# Patient Record
Sex: Female | Born: 1950 | Race: White | Hispanic: No | Marital: Married | Smoking: Current every day smoker
Health system: Southern US, Community
[De-identification: ages and names within clinical notes are randomized; demographics above are authoritative.]

## PROBLEM LIST (undated history)

## (undated) DIAGNOSIS — E039 Hypothyroidism, unspecified: Secondary | ICD-10-CM

## (undated) DIAGNOSIS — M797 Fibromyalgia: Secondary | ICD-10-CM

## (undated) DIAGNOSIS — I499 Cardiac arrhythmia, unspecified: Secondary | ICD-10-CM

## (undated) DIAGNOSIS — F331 Major depressive disorder, recurrent, moderate: Secondary | ICD-10-CM

## (undated) DIAGNOSIS — M810 Age-related osteoporosis without current pathological fracture: Secondary | ICD-10-CM

## (undated) DIAGNOSIS — I10 Essential (primary) hypertension: Secondary | ICD-10-CM

## (undated) DIAGNOSIS — F17200 Nicotine dependence, unspecified, uncomplicated: Secondary | ICD-10-CM

## (undated) DIAGNOSIS — J189 Pneumonia, unspecified organism: Secondary | ICD-10-CM

## (undated) DIAGNOSIS — N1831 Chronic kidney disease, stage 3a: Secondary | ICD-10-CM

## (undated) DIAGNOSIS — S72009A Fracture of unspecified part of neck of unspecified femur, initial encounter for closed fracture: Secondary | ICD-10-CM

## (undated) DIAGNOSIS — R0600 Dyspnea, unspecified: Secondary | ICD-10-CM

## (undated) DIAGNOSIS — M1990 Unspecified osteoarthritis, unspecified site: Secondary | ICD-10-CM

## (undated) DIAGNOSIS — D071 Carcinoma in situ of vulva: Secondary | ICD-10-CM

## (undated) DIAGNOSIS — T8859XA Other complications of anesthesia, initial encounter: Secondary | ICD-10-CM

## (undated) DIAGNOSIS — I70299 Other atherosclerosis of native arteries of extremities, unspecified extremity: Secondary | ICD-10-CM

## (undated) DIAGNOSIS — Z95820 Peripheral vascular angioplasty status with implants and grafts: Secondary | ICD-10-CM

## (undated) DIAGNOSIS — I739 Peripheral vascular disease, unspecified: Secondary | ICD-10-CM

## (undated) DIAGNOSIS — E079 Disorder of thyroid, unspecified: Secondary | ICD-10-CM

## (undated) DIAGNOSIS — K219 Gastro-esophageal reflux disease without esophagitis: Secondary | ICD-10-CM

## (undated) HISTORY — DX: Cardiac arrhythmia, unspecified: I49.9

## (undated) HISTORY — DX: Unspecified osteoarthritis, unspecified site: M19.90

## (undated) HISTORY — DX: Disorder of thyroid, unspecified: E07.9

## (undated) HISTORY — DX: Gastro-esophageal reflux disease without esophagitis: K21.9

## (undated) HISTORY — DX: Essential (primary) hypertension: I10

## (undated) HISTORY — PX: CHOLECYSTECTOMY: SHX55

## (undated) HISTORY — DX: Fracture of unspecified part of neck of unspecified femur, initial encounter for closed fracture: S72.009A

## (undated) HISTORY — DX: Carcinoma in situ of vulva: D07.1

## (undated) HISTORY — DX: Fibromyalgia: M79.7

---

## 1978-09-29 HISTORY — PX: OTHER SURGICAL HISTORY: SHX169

## 1993-01-28 HISTORY — PX: ABDOMINAL HYSTERECTOMY: SHX81

## 2007-12-01 ENCOUNTER — Inpatient Hospital Stay: Payer: Self-pay | Admitting: Internal Medicine

## 2008-01-14 ENCOUNTER — Ambulatory Visit: Payer: Self-pay | Admitting: Gastroenterology

## 2008-06-28 ENCOUNTER — Ambulatory Visit: Payer: Self-pay | Admitting: Internal Medicine

## 2008-06-29 ENCOUNTER — Ambulatory Visit: Payer: Self-pay | Admitting: Internal Medicine

## 2008-07-28 ENCOUNTER — Ambulatory Visit: Payer: Self-pay | Admitting: Internal Medicine

## 2008-08-23 ENCOUNTER — Ambulatory Visit: Payer: Self-pay | Admitting: Internal Medicine

## 2008-08-28 ENCOUNTER — Ambulatory Visit: Payer: Self-pay | Admitting: Internal Medicine

## 2008-11-28 ENCOUNTER — Ambulatory Visit: Payer: Self-pay | Admitting: Internal Medicine

## 2008-12-20 ENCOUNTER — Ambulatory Visit: Payer: Self-pay | Admitting: Internal Medicine

## 2009-05-31 ENCOUNTER — Ambulatory Visit: Payer: Self-pay | Admitting: Rheumatology

## 2009-06-28 ENCOUNTER — Ambulatory Visit: Payer: Self-pay | Admitting: Internal Medicine

## 2009-07-10 ENCOUNTER — Ambulatory Visit: Payer: Self-pay | Admitting: Internal Medicine

## 2009-07-28 ENCOUNTER — Ambulatory Visit: Payer: Self-pay | Admitting: Internal Medicine

## 2010-02-20 ENCOUNTER — Ambulatory Visit: Payer: Self-pay | Admitting: Internal Medicine

## 2010-02-28 ENCOUNTER — Ambulatory Visit: Payer: Self-pay | Admitting: Internal Medicine

## 2010-06-20 ENCOUNTER — Inpatient Hospital Stay: Payer: Self-pay | Admitting: Emergency Medicine

## 2010-06-21 HISTORY — PX: APPENDECTOMY: SHX54

## 2010-06-26 LAB — PATHOLOGY REPORT

## 2010-07-31 ENCOUNTER — Ambulatory Visit: Payer: Self-pay | Admitting: Internal Medicine

## 2010-08-09 ENCOUNTER — Emergency Department: Payer: Self-pay | Admitting: Unknown Physician Specialty

## 2010-08-29 ENCOUNTER — Ambulatory Visit: Payer: Self-pay | Admitting: Internal Medicine

## 2011-03-13 ENCOUNTER — Ambulatory Visit: Payer: Self-pay | Admitting: Internal Medicine

## 2012-05-26 ENCOUNTER — Ambulatory Visit: Payer: Self-pay | Admitting: Gynecologic Oncology

## 2012-05-28 ENCOUNTER — Ambulatory Visit: Payer: Self-pay | Admitting: Gynecologic Oncology

## 2012-07-14 ENCOUNTER — Encounter: Payer: Self-pay | Admitting: Nurse Practitioner

## 2012-07-14 ENCOUNTER — Encounter: Payer: Self-pay | Admitting: Cardiothoracic Surgery

## 2012-07-28 ENCOUNTER — Encounter: Payer: Self-pay | Admitting: Nurse Practitioner

## 2012-07-28 ENCOUNTER — Encounter: Payer: Self-pay | Admitting: Cardiothoracic Surgery

## 2013-02-25 ENCOUNTER — Ambulatory Visit: Payer: Self-pay | Admitting: Neurosurgery

## 2013-04-26 ENCOUNTER — Ambulatory Visit: Payer: Self-pay | Admitting: Gynecologic Oncology

## 2013-05-03 ENCOUNTER — Ambulatory Visit: Payer: Self-pay | Admitting: Gynecologic Oncology

## 2013-05-17 ENCOUNTER — Other Ambulatory Visit: Payer: Self-pay | Admitting: General Surgery

## 2013-05-17 ENCOUNTER — Encounter: Payer: Self-pay | Admitting: General Surgery

## 2013-05-17 ENCOUNTER — Ambulatory Visit (INDEPENDENT_AMBULATORY_CARE_PROVIDER_SITE_OTHER): Payer: BC Managed Care – PPO | Admitting: General Surgery

## 2013-05-17 VITALS — BP 132/74 | HR 74 | Resp 16 | Ht 60.0 in | Wt 223.0 lb

## 2013-05-17 DIAGNOSIS — K469 Unspecified abdominal hernia without obstruction or gangrene: Secondary | ICD-10-CM | POA: Insufficient documentation

## 2013-05-17 DIAGNOSIS — F17200 Nicotine dependence, unspecified, uncomplicated: Secondary | ICD-10-CM

## 2013-05-17 DIAGNOSIS — F17210 Nicotine dependence, cigarettes, uncomplicated: Secondary | ICD-10-CM

## 2013-05-17 NOTE — Progress Notes (Addendum)
Patient ID: Tracy FillerDebra S Penninger, female   DOB: 05/01/1950, 63 y.o.   MRN: [id number]  Chief Complaint  Patient presents with  . Other    abdominal hernia    HPI Tracy Durham is a 63 y.o. female who presents for an evaluation of an abdominal hernia. Patient states she noticed it about one year ago and its getting bigger. She states she is having occasional burning pains in her right lower quadrant.  The patient reports no difficulty with bowel or bladder function. 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  The patient is primary caregiver for her 11080 year old mother who now lives with the patient.  HPI  Past Medical History  Diagnosis Date  . Arthritis   . Fibromyalgia   . Thyroid disease   . Hypertension   . GERD (gastroesophageal reflux disease)   . Irregular heart beat   . Fractured hip     Past Surgical History  Procedure Laterality Date  . Appendectomy  2013  . Cesarean section    . Cholecystectomy    . Gastric stapeling  1980's  . Abdominal hysterectomy  1995    [facility]    Family History  Problem Relation Age of Onset  . Alcohol abuse Mother   . Dementia Mother   . Cancer Mother     ovarian  . Diabetes Father   . Hypertension Father   . Stroke Father   . Alcohol abuse Brother   . Alcohol abuse Sister   . Cancer Sister     cervical    Social History History  Substance Use Topics  . Smoking status: Current Every Day Smoker -- 0.50 packs/day for 30 years  . Smokeless tobacco: Not on file  . Alcohol Use: No    Allergies  Allergen Reactions  . Bactrim [Sulfamethoxazole-Tmp Ds] Hives  . Biaxin [Clarithromycin] Hives  . Ciprofloxacin Hives  . Keflex [Cephalexin] Hives  . Morphine And Related Hives  . Sulfa Antibiotics Hives  . Tramadol Hives    Current Outpatient Prescriptions  Medication Sig Dispense Refill  . amLODipine (NORVASC) 5 MG tablet Take 1 tablet by mouth daily.      . chlorzoxazone (PARAFON) 500 MG tablet Take 1  tablet by mouth daily.      . citalopram (CELEXA) 40 MG tablet Take 40 mg by mouth daily.      . hydrochlorothiazide (MICROZIDE) 12.5 MG capsule Take 1 capsule by mouth daily.      Marland Kitchen. HYDROcodone-acetaminophen (NORCO/VICODIN) 5-325 MG per tablet Take 1 tablet by mouth every 6 (six) hours as needed for moderate pain.      Marland Kitchen. levothyroxine (SYNTHROID, LEVOTHROID) 50 MCG tablet Take 1 tablet by mouth daily.      . metoprolol (LOPRESSOR) 50 MG tablet Take 1 tablet by mouth daily.      . naproxen (NAPROSYN) 500 MG tablet Take 1 tablet by mouth daily.      Marland Kitchen. omeprazole (PRILOSEC) 40 MG capsule Take 1 capsule by mouth daily.      . potassium chloride SA (K-DUR,KLOR-CON) 20 MEQ tablet 1/2 tab by mouth on Monday, Wednesday, and Friday.       No current facility-administered medications for this visit.    Review of Systems Review of Systems  Constitutional: Negative.   Respiratory: Negative.   Cardiovascular: Negative.   Gastrointestinal: Negative.     Blood pressure 132/74, pulse 74, resp. rate 16, height 5' (1.524 m), weight 223 lb (101.152 kg).  Physical  Exam Physical Exam  Constitutional: She is oriented to person, place, and time. She appears well-developed and well-nourished.  Eyes: Conjunctivae are normal.  Neck: Neck supple.  Cardiovascular: Normal rate, regular rhythm, normal heart sounds, intact distal pulses and normal pulses.   Pulmonary/Chest: Effort normal and breath sounds normal.  Abdominal: Soft. Normal appearance and bowel sounds are normal. There is tenderness in the right lower quadrant.    Neurological: She is alert and oriented to person, place, and time.  Skin: Skin is warm and dry.    Data Reviewed Operative report from a Jun 01, 2010 was reviewed. Extensive adhesions identified. Acute appendicitis with perforation. Pathology showed acute appendicitis and periappendicitis. Preprocedure CT scan showed evidence of acute appendicitis with no definite abscess. A drain  was placed postoperatively. Cultures were not obtained.  GYN oncology notes of May 04, 2013 were reviewed.  Assessment    Incisional hernia.     Plan    CT scan to evaluate for the extent of the hernia and other possible fascial defects not clinically appreciable is indicated. The patient is at high risk for recurrence based on her age, obesity and ongoing smoking. Only one of these is readily malleable, and smoking cessation has been encouraged. Considering the stresses of caring for her mother and her job in customer service, this may be difficult. We'll plan to reassess her after CT is available.     Patient is scheduled for a CT of the abdomen and pelvis with contrast [facility] on 05[facility]/13, she will arrive at 8:45 am. Patient will have Creat[facility]. Patient is aware of date, time, and instructions.  PCP: Carmine SavoyKhan, Neelam S    Trenton Passow W Ekam Besson 05/17/2013, 9:19 PM

## 2013-05-17 NOTE — Patient Instructions (Addendum)
Patient to have cat scan .  Patient is scheduled for a CT of the abdomen and pelvis with [facility] on 05/[facility]/13, she will arrive at 8:45 am. Patient will have Creatinine and Bun [facility]. Patient is aware of date, time, and instructions.

## 2013-05-18 LAB — BUN+CREAT
BUN / CREAT RATIO: 18 (ref 11–26)
BUN: 14 mg/dL (ref 8–27)
CREATININE: 0.8 mg/dL (ref 0.57–1.00)
GFR calc Af Amer: 92 mL/min/{1.73_m2} (ref >59)
GFR calc non Af Amer: 80 mL/min/{1.73_m2} (ref >59)

## 2013-05-24 ENCOUNTER — Encounter: Payer: Self-pay | Admitting: General Surgery

## 2013-05-24 ENCOUNTER — Telehealth: Payer: Self-pay

## 2013-05-24 NOTE — Telephone Encounter (Signed)
Patient called asking about her CT results.

## 2013-05-25 ENCOUNTER — Telehealth: Payer: Self-pay | Admitting: *Deleted

## 2013-05-25 NOTE — Telephone Encounter (Signed)
Notified patient as instructed.  She does want to schedule hernia surgery, she will try to stop smoking before her appt with Dr. Lemar LivingsByrnett on May 19th 2015 at 2:45.

## 2013-05-25 NOTE — Telephone Encounter (Signed)
Message copied by Currie ParisHATCH, Marita Burnsed M on Tue May 25, 2013  8:27 AM ------      Message from: GarrisonBYRNETT, UtahJEFFREY W      Created: Mon May 24, 2013  9:53 PM       Please notify the patient the CT shows only the one hernia at the site of her appendectomy.        Ideally, surgery would be postponed until after she has discontinued smoking for one month.       I recognize this may not be possible.      Arrange f/u to review surgical options at a time convenient for the patient. Thanks. ------

## 2013-05-28 ENCOUNTER — Ambulatory Visit: Payer: Self-pay | Admitting: Gynecologic Oncology

## 2013-06-15 ENCOUNTER — Encounter: Payer: Self-pay | Admitting: General Surgery

## 2013-06-15 ENCOUNTER — Ambulatory Visit (INDEPENDENT_AMBULATORY_CARE_PROVIDER_SITE_OTHER): Payer: BC Managed Care – PPO | Admitting: General Surgery

## 2013-06-15 VITALS — BP 126/72 | HR 56 | Resp 12 | Ht 60.0 in | Wt 224.0 lb

## 2013-06-15 DIAGNOSIS — K432 Incisional hernia without obstruction or gangrene: Secondary | ICD-10-CM | POA: Insufficient documentation

## 2013-06-15 DIAGNOSIS — K469 Unspecified abdominal hernia without obstruction or gangrene: Secondary | ICD-10-CM

## 2013-06-15 NOTE — Patient Instructions (Addendum)
Open Hernia Repair Open hernia repair is surgery to fix a hernia. A hernia occurs when an internal organ or tissue pushes out through a weak spot in the abdominal wall muscles. Hernias commonly occur in the groin and around the navel. Most hernias tend to get worse over time. Surgery is often done to prevent the hernia from getting bigger, becoming uncomfortable, or becoming an emergency. Emergency surgery may be needed if abdominal contents get stuck in the opening (incarcerated hernia) or the blood supply gets cut off (strangulated hernia). In an open repair, a large cut (incision) is made in the abdomen to perform the surgery. LET [facility] CARE PROVIDER KNOW ABOUT:  Any allergies you have.  All medicines you are taking, including vitamins, herbs, eye drops, creams, and over-the-counter medicines.  Previous problems you or members of your family have had with the use of anesthetics.  Any blood disorders you have.  Previous surgeries you have had.  Medical conditions you have. RISKS AND COMPLICATIONS Generally, this is a safe procedure. However, as with any procedure, complications can occur. Possible complications include:  Infection.  Bleeding.  Nerve injury.  Chronic pain.  The hernia can come back.  Injury to the intestines. BEFORE THE PROCEDURE  Ask your health care provider about changing or stopping any regular medicines. Avoid taking aspirin or blood thinners as directed by your health care provider.  Do noteat or drink anything after midnight the night before surgery.  If you smoke, do not smoke for at least 2 weeks before your surgery.  Do not drink alcohol the day before your surgery.  Let your health care provider know if you develop a cold or any infection before your surgery.  Arrange for someone to drive you home after the procedure or after your hospital stay. Also arrange for someone to help you with activities during recovery. PROCEDURE   Small  monitors will be put on your body. They are used to check your heart, blood pressure, and oxygen level.   An IV access tube will be put into one of your veins. Medicine will be able to flow directly into your body through this IV tube.   You might be given a medicine to help you relax (sedative).   You will be given a medicine to make you sleep (general anesthetic). A breathing tube may be placed into your lungs during the procedure.  A cut (incision) is made over the hernia defect, and the contents are pushed back into the abdomen.  If the hernia is small, stitches may be used to bring the muscle edges back together.  Typically, a surgeon will place a mesh patch made of man-made material (synthetic) to cover the defect. The mesh is sewn to healthy muscle. This reduces the risk of the hernia coming back.  The tissue and skin over the hernia are then closed with stitches or staples.  If the hernia was large, a drain may be left in place to collect excess fluid where the hernia used to be.  Bandages (dressings) are used to cover the incision. AFTER THE PROCEDURE  You will be taken to a recovery area where your progress will be monitored.  If the hernia was small or in the groin (inguinal) region, you will likely be allowed to go home once you are awake, stable, and taking fluids well.  If the hernia was large, you may have to wait for your bowel function to return. You may need to stay in the hospital  for 2 3 days until you can eat and your pain is controlled. A drain may be left in place for 5 7 days. You will be taught how to care for the drain. Document Released: 07/10/2000 Document Revised: 11/04/2012 Document Reviewed: 08/26/2012 [facility] Patient Information 2014 [employer] [place].  Patient [facility] been [facility] on [facility]. She will pre admit by phone on 06/17/13. Patient is aware of date and instructions.

## 2013-06-15 NOTE — Progress Notes (Signed)
Patient ID: Tracy Durham, female   DOB: 05/09/1950, 63 y.o.   MRN: [id number]  Chief Complaint  Patient presents with  . Pre-op Exam    Incisional hernia.     HPI Tracy FillerDebra S Loyola is a 63 y.o. female here today for her pre op Incisional hernia surgery. The patient has discontinued smoking as discussed at her initial visit. This was the only short-term option available to decrease her morbidity and recurrence risk after hernia repair.  HPI  Past Medical History  Diagnosis Date  . Arthritis   . Fibromyalgia   . Thyroid disease   . Hypertension   . GERD (gastroesophageal reflux disease)   . Irregular heart beat   . Fractured hip     Past Surgical History  Procedure Laterality Date  . Cesarean section    . Cholecystectomy    . Gastric stapeling  1980's  . Abdominal hysterectomy  1995    [facility]  . Appendectomy  Jun 21, 2010    acute appendicitis and periappendicitis    Family History  Problem Relation Age of Onset  . Alcohol abuse Mother   . Dementia Mother   . Cancer Mother     ovarian  . Diabetes Father   . Hypertension Father   . Stroke Father   . Alcohol abuse Brother   . Alcohol abuse Sister   . Cancer Sister     cervical    Social History History  Substance Use Topics  . Smoking status: Former Smoker -- 0.50 packs/day for 30 years    Quit date: 05/25/2013  . Smokeless tobacco: Never Used  . Alcohol Use: No    Allergies  Allergen Reactions  . Bactrim [Sulfamethoxazole-Tmp Ds] Hives  . Biaxin [Clarithromycin] Hives  . Ciprofloxacin Hives  . Keflex [Cephalexin] Hives  . Morphine And Related Hives  . Sulfa Antibiotics Hives  . Tramadol Hives    Current Outpatient Prescriptions  Medication Sig Dispense Refill  . amLODipine (NORVASC) 5 MG tablet Take 1 tablet by mouth daily.      . chlorzoxazone (PARAFON) 500 MG tablet Take 1 tablet by mouth daily.      . citalopram (CELEXA) 40 MG tablet Take 40 mg by mouth daily.      . hydrochlorothiazide  (MICROZIDE) 12.5 MG capsule Take 1 capsule by mouth daily.      Marland Kitchen. HYDROcodone-acetaminophen (NORCO/VICODIN) 5-325 MG per tablet Take 1 tablet by mouth every 6 (six) hours as needed for moderate pain.      Marland Kitchen. levothyroxine (SYNTHROID, LEVOTHROID) 50 MCG tablet Take 1 tablet by mouth daily.      . metoprolol (LOPRESSOR) 50 MG tablet Take 1 tablet by mouth daily.      . naproxen (NAPROSYN) 500 MG tablet Take 1 tablet by mouth daily.      Marland Kitchen. omeprazole (PRILOSEC) 40 MG capsule Take 1 capsule by mouth daily.      . potassium chloride SA (K-DUR,KLOR-CON) 20 MEQ tablet 1/2 tab by mouth on Monday, Wednesday, and Friday.       No current facility-administered medications for this visit.    Review of Systems Review of Systems  Constitutional: Negative.   Respiratory: Negative.   Cardiovascular: Negative.     Blood pressure 126/72, pulse 56, resp. rate 12, height 5' (1.524 m), weight 224 lb (101.606 kg).  Physical Exam Physical Exam  Constitutional: She is oriented to person, place, and time. She appears well-developed and well-nourished.  Eyes: Conjunctivae are normal. No scleral  icterus.  Neck: Neck supple.  Cardiovascular: Normal rate, regular rhythm and normal heart sounds.   Pulmonary/Chest: Effort normal and breath sounds normal.  Abdominal: Soft. Normal appearance and bowel sounds are normal. There is no tenderness. A hernia is present.  Lymphadenopathy:    She has no cervical adenopathy.  Neurological: She is alert and oriented to person, place, and time.  Skin: Skin is warm and dry.      Data Reviewed CT of the abdomen and pelvis completed May 20, 2013 at [facility] had previously been reviewed. This identifies a single 4 x 7 cm defect at site of paramedian incision containing non-obstructed ascending colon and small bowel. No clearly identifiable secondary defects.    Assessment    Incisional hernia.     Plan    The patient has done very well with smoking cessation  as as evidence by today's pulmonary exam and her reported improvement in exercise tolerance. Considering the extensive adhesions described at the time of her appendectomy, I think she will be best served by an open procedure. The roll of prosthetic mesh to minimize recurrence was reviewed. The importance of ongoing smoking cessation was emphasized. I anticipate she may require 24-48 hours postoperative observation for comfort.     Patient has been [facility] on [facility]. She will pre admit by phone on 06/17/13. Patient is aware of date and instructions.   PCP: Carmine SavoyKhan, Neelam S   Marylee Belzer W Huston Stonehocker 06/15/2013, 6:55 PM

## 2013-06-16 ENCOUNTER — Telehealth: Payer: Self-pay | Admitting: *Deleted

## 2013-06-16 ENCOUNTER — Other Ambulatory Visit: Payer: Self-pay | Admitting: General Surgery

## 2013-06-16 DIAGNOSIS — K432 Incisional hernia without obstruction or gangrene: Secondary | ICD-10-CM

## 2013-06-16 NOTE — Telephone Encounter (Signed)
Called pt to get info on what wound center she went to and what doctor she saw there

## 2013-06-22 ENCOUNTER — Ambulatory Visit: Payer: Self-pay | Admitting: General Surgery

## 2013-06-22 DIAGNOSIS — Z01810 Encounter for preprocedural cardiovascular examination: Secondary | ICD-10-CM

## 2013-06-22 DIAGNOSIS — I10 Essential (primary) hypertension: Secondary | ICD-10-CM

## 2013-06-22 DIAGNOSIS — R011 Cardiac murmur, unspecified: Secondary | ICD-10-CM

## 2013-06-22 LAB — CBC WITH DIFFERENTIAL/PLATELET
Basophil #: 0 10*3/uL (ref 0.0–0.1)
Basophil %: 0.5 %
EOS ABS: 0.4 10*3/uL (ref 0.0–0.7)
Eosinophil %: 6.8 %
HCT: 41.5 % (ref 35.0–47.0)
HGB: 13.9 g/dL (ref 12.0–16.0)
LYMPHS PCT: 18.8 %
Lymphocyte #: 1.1 10*3/uL (ref 1.0–3.6)
MCH: 32.3 pg (ref 26.0–34.0)
MCHC: 33.5 g/dL (ref 32.0–36.0)
MCV: 97 fL (ref 80–100)
Monocyte #: 0.7 x10 3/mm (ref 0.2–0.9)
Monocyte %: 11.9 %
NEUTROS PCT: 62 %
Neutrophil #: 3.5 10*3/uL (ref 1.4–6.5)
Platelet: 195 10*3/uL (ref 150–440)
RBC: 4.3 10*6/uL (ref 3.80–5.20)
RDW: 14.2 % (ref 11.5–14.5)
WBC: 5.7 10*3/uL (ref 3.6–11.0)

## 2013-06-22 LAB — BASIC METABOLIC PANEL
Anion Gap: 3 — ABNORMAL LOW (ref 7–16)
BUN: 20 mg/dL — ABNORMAL HIGH (ref 7–18)
CHLORIDE: 107 mmol/L (ref 98–107)
Calcium, Total: 8.9 mg/dL (ref 8.5–10.1)
Co2: 29 mmol/L (ref 21–32)
Creatinine: 0.87 mg/dL (ref 0.60–1.30)
EGFR (Non-African Amer.): >60
Glucose: 86 mg/dL (ref 65–99)
Osmolality: 279 (ref 275–301)
Potassium: 4.2 mmol/L (ref 3.5–5.1)
Sodium: 139 mmol/L (ref 136–145)

## 2013-06-23 ENCOUNTER — Encounter: Payer: Self-pay | Admitting: General Surgery

## 2013-06-28 ENCOUNTER — Ambulatory Visit: Payer: Self-pay | Admitting: General Surgery

## 2013-06-28 DIAGNOSIS — K432 Incisional hernia without obstruction or gangrene: Secondary | ICD-10-CM

## 2013-06-28 HISTORY — PX: HERNIA REPAIR: SHX51

## 2013-06-29 ENCOUNTER — Encounter: Payer: Self-pay | Admitting: General Surgery

## 2013-07-07 ENCOUNTER — Ambulatory Visit (INDEPENDENT_AMBULATORY_CARE_PROVIDER_SITE_OTHER): Payer: BC Managed Care – PPO | Admitting: General Surgery

## 2013-07-07 ENCOUNTER — Encounter: Payer: Self-pay | Admitting: General Surgery

## 2013-07-07 VITALS — BP 162/82 | HR 72 | Resp 16 | Ht 60.0 in | Wt 230.0 lb

## 2013-07-07 DIAGNOSIS — K432 Incisional hernia without obstruction or gangrene: Secondary | ICD-10-CM

## 2013-07-07 DIAGNOSIS — R600 Localized edema: Secondary | ICD-10-CM

## 2013-07-07 DIAGNOSIS — R609 Edema, unspecified: Secondary | ICD-10-CM

## 2013-07-07 NOTE — Progress Notes (Signed)
Patient ID: Tracy FillerDebra S Durham, female   DOB: 12/05/1950, 63 y.o.   MRN: [id number]  Chief Complaint  Patient presents with  . Follow-up    post op ventral hernia repair    HPI Tracy Durham is a 63 y.o. female who presents for a post op ventral hernia repair. The procedure was performed on 06/28/13. The patient states she has a lot of fluid that is noticed from her waist down. This was first reported by phone on Saturday July 03, 2013. The patient was instructed to make use of an additional dose of HCTZ and plans were for a brief visit at the hospital where she was attending her mother on the morning of June 7. That day she slept in and I did not get a chance to see her at that time. She reports that when she gets up in the morning the edema is markedly improved, but she's been sitting in the hospital with her mother and it worsens through the course of the day. The swelling is symmetrical and lower extremities into lesser extent and lower abdomen. Patient denies any calf tenderness shortness of breath or GI symptoms. No other complaints at this time.    HPI  Past Medical History  Diagnosis Date  . Arthritis   . Fibromyalgia   . Thyroid disease   . Hypertension   . GERD (gastroesophageal reflux disease)   . Irregular heart beat   . Fractured hip     Past Surgical History  Procedure Laterality Date  . Cesarean section    . Cholecystectomy    . Gastric stapeling  1980's  . Abdominal hysterectomy  1995    [facility]  . Appendectomy  Jun 21, 2010    acute appendicitis and periappendicitis  . Hernia repair  06/28/13    Family History  Problem Relation Age of Onset  . Alcohol abuse Mother   . Dementia Mother   . Cancer Mother     ovarian  . Diabetes Father   . Hypertension Father   . Stroke Father   . Alcohol abuse Brother   . Alcohol abuse Sister   . Cancer Sister     cervical    Social History History  Substance Use Topics  . Smoking status: Former Smoker -- 0.50  packs/day for 30 years    Quit date: 05/25/2013  . Smokeless tobacco: Never Used  . Alcohol Use: No    Allergies  Allergen Reactions  . Bactrim [Sulfamethoxazole-Tmp Ds] Hives  . Biaxin [Clarithromycin] Hives  . Ciprofloxacin Hives  . Keflex [Cephalexin] Hives  . Morphine And Related Hives  . Sulfa Antibiotics Hives  . Tramadol Hives    Current Outpatient Prescriptions  Medication Sig Dispense Refill  . amLODipine (NORVASC) 5 MG tablet Take 1 tablet by mouth daily.      . chlorzoxazone (PARAFON) 500 MG tablet Take 1 tablet by mouth daily as needed.       . citalopram (CELEXA) 40 MG tablet Take 40 mg by mouth daily.      . hydrochlorothiazide (MICROZIDE) 12.5 MG capsule Take 1 capsule by mouth daily.      Marland Kitchen. HYDROcodone-acetaminophen (NORCO/VICODIN) 5-325 MG per tablet Take 1 tablet by mouth every 6 (six) hours as needed for moderate pain.      Marland Kitchen. levothyroxine (SYNTHROID, LEVOTHROID) 50 MCG tablet Take 1 tablet by mouth daily.      . metoprolol (LOPRESSOR) 50 MG tablet Take 1 tablet by mouth daily.      .Marland Kitchen  naproxen (NAPROSYN) 500 MG tablet Take 1 tablet by mouth daily.      Marland Kitchen omeprazole (PRILOSEC) 40 MG capsule Take 1 capsule by mouth daily.      . potassium chloride SA (K-DUR,KLOR-CON) 20 MEQ tablet 1/2 tab by mouth on Monday, Wednesday, and Friday.       No current facility-administered medications for this visit.    Review of Systems Review of Systems  Constitutional: Negative.   Respiratory: Negative.   Cardiovascular: Negative.   Gastrointestinal: Negative.     Blood pressure 162/82, pulse 72, resp. rate 16, height 5' (1.524 m), weight 230 lb (104.327 kg).  Physical Exam Physical Exam  Constitutional: She is oriented to person, place, and time. She appears well-developed and well-nourished.  Cardiovascular: Normal rate, regular rhythm and normal heart sounds.   No murmur heard. 2 + edema   Pulmonary/Chest: Effort normal and breath sounds normal.  Abdominal: Soft.  Normal appearance and bowel sounds are normal. There is no hepatosplenomegaly. There is no tenderness.  Well healing abdominal incision.   Neurological: She is alert and oriented to person, place, and time.  Skin: Skin is warm and dry.      Assessment    Lower extremity edema.  Doing well post ventral hernia repair.    Plan    The patient was instructed to increase her HCTZ 25 mg daily and to continue her other antihypertensives. She watch her salt intake. She is to minimize long periods of sitting. She will return in one week for BP and weight check. She may increase her activity as tolerated, that was cautioned to avoid strenuous lifting for     PCP: Sherrye Payor 07/09/2013, 12:19 PM

## 2013-07-07 NOTE — Patient Instructions (Signed)
Patient advised to use heating pad to help with any pain or swelling. Patient also advised to double Hydrochlorothiazide daily for 1 week. The patient is aware to call back for any questions or concerns. Patient to return in 1 month for follow up.

## 2013-07-09 DIAGNOSIS — R600 Localized edema: Secondary | ICD-10-CM | POA: Insufficient documentation

## 2013-07-14 ENCOUNTER — Telehealth: Payer: Self-pay | Admitting: *Deleted

## 2013-07-14 ENCOUNTER — Ambulatory Visit: Payer: BC Managed Care – PPO

## 2013-07-14 NOTE — Telephone Encounter (Signed)
Pt just wanted to let you know that she cant come anytime for appts due to her mother being sick but she just wanted to let you know that she has lost 8.1 pounds of fluid since the 11th of June.

## 2013-08-09 ENCOUNTER — Encounter: Payer: Self-pay | Admitting: General Surgery

## 2013-08-09 ENCOUNTER — Ambulatory Visit (INDEPENDENT_AMBULATORY_CARE_PROVIDER_SITE_OTHER): Payer: BC Managed Care – PPO | Admitting: General Surgery

## 2013-08-09 VITALS — BP 130/70 | HR 72 | Resp 14 | Ht 60.0 in | Wt 223.0 lb

## 2013-08-09 DIAGNOSIS — K432 Incisional hernia without obstruction or gangrene: Secondary | ICD-10-CM

## 2013-08-09 DIAGNOSIS — T792XXA Traumatic secondary and recurrent hemorrhage and seroma, initial encounter: Secondary | ICD-10-CM

## 2013-08-09 DIAGNOSIS — IMO0001 Reserved for inherently not codable concepts without codable children: Secondary | ICD-10-CM

## 2013-08-09 DIAGNOSIS — T888XXA Other specified complications of surgical and medical care, not elsewhere classified, initial encounter: Secondary | ICD-10-CM

## 2013-08-09 NOTE — Progress Notes (Signed)
Patient ID: Tracy Durham, female   DOB: 1950/07/11, 63 y.o.   MRN: [id number]  Chief Complaint  Patient presents with  . Routine Post Op    ventral hernia    HPI Tracy Durham is a 63 y.o. female who presents for his month post op ventral hernia repair. The procedure was performed on 06/28/13.Patient states she is doing well.    HPI  Past Medical History  Diagnosis Date  . Arthritis   . Fibromyalgia   . Thyroid disease   . Hypertension   . GERD (gastroesophageal reflux disease)   . Irregular heart beat   . Fractured hip     Past Surgical History  Procedure Laterality Date  . Cesarean section    . Cholecystectomy    . Gastric stapeling  1980's  . Abdominal hysterectomy  1995    [facility]  . Appendectomy  Jun 21, 2010    acute appendicitis and periappendicitis  . Hernia repair  06/28/13    Family History  Problem Relation Age of Onset  . Alcohol abuse Mother   . Dementia Mother   . Cancer Mother     ovarian  . Diabetes Father   . Hypertension Father   . Stroke Father   . Alcohol abuse Brother   . Alcohol abuse Sister   . Cancer Sister     cervical    Social History History  Substance Use Topics  . Smoking status: Former Smoker -- 0.50 packs/day for 30 years    Quit date: 05/25/2013  . Smokeless tobacco: Never Used  . Alcohol Use: No    Allergies  Allergen Reactions  . Bactrim [Sulfamethoxazole-Tmp Ds] Hives  . Biaxin [Clarithromycin] Hives  . Ciprofloxacin Hives  . Keflex [Cephalexin] Hives  . Morphine And Related Hives  . Sulfa Antibiotics Hives  . Tramadol Hives    Current Outpatient Prescriptions  Medication Sig Dispense Refill  . amLODipine (NORVASC) 5 MG tablet Take 1 tablet by mouth daily.      . chlorzoxazone (PARAFON) 500 MG tablet Take 1 tablet by mouth daily as needed.       . citalopram (CELEXA) 40 MG tablet Take 40 mg by mouth daily.      . hydrochlorothiazide (MICROZIDE) 12.5 MG capsule Take 1 capsule by mouth daily.      Marland Kitchen  HYDROcodone-acetaminophen (NORCO/VICODIN) 5-325 MG per tablet Take 1 tablet by mouth every 6 (six) hours as needed for moderate pain.      Marland Kitchen levothyroxine (SYNTHROID, LEVOTHROID) 50 MCG tablet Take 1 tablet by mouth daily.      . metoprolol (LOPRESSOR) 50 MG tablet Take 1 tablet by mouth daily.      . naproxen (NAPROSYN) 500 MG tablet Take 1 tablet by mouth daily.      Marland Kitchen omeprazole (PRILOSEC) 40 MG capsule Take 1 capsule by mouth daily.      . potassium chloride SA (K-DUR,KLOR-CON) 20 MEQ tablet 1/2 tab by mouth on Monday, Wednesday, and Friday.       No current facility-administered medications for this visit.    Review of Systems Review of Systems  Constitutional: Negative.   Respiratory: Negative.   Cardiovascular: Negative.     Blood pressure 130/70, pulse 72, resp. rate 14, height 5' (1.524 m), weight 223 lb (101.152 kg).  Physical Exam Physical Exam  Constitutional: She is oriented to person, place, and time. She appears well-developed and well-nourished.  Eyes: Conjunctivae are normal. No scleral icterus.  Neck: Neck supple.  Cardiovascular: Normal rate, regular rhythm and normal heart sounds.   Abdominal: Soft. Normal appearance and bowel sounds are normal.    Well healing abdominal incision.  Drained 125 ml of fluid    Neurological: She is alert and oriented to person, place, and time.  Skin: Skin is warm and dry.    Data Reviewed Limited ultrasound of the area (no images, no charge) suggested a loculated fluid collection.  Assessment    Seroma post ventral hernia repair.  No evidence of recurrent herniation.    Plan    The area was prepped with ChloraPrep and 1 cc of 1% plain Xylocaine utilized. Making use of a 20-gauge needle 125 cc of thin odorless fluid consistent with a resolving hematoma was aspirated with near complete resolution of the seroma cavity. A culture was sent for aerobic organisms. The procedure was well tolerated.  The patient was encouraged  to make use of local heat to the area to accelerate resorb showed residual fluid.  We'll plan for a followup examination in 6 weeks. The patient will be released to return to work. She was instructed on proper lifting technique.     PCP: Sherrye PayorKhan, Neelam S    Jazmine Longshore W 08/10/2013, 3:11 PM

## 2013-08-10 DIAGNOSIS — IMO0002 Reserved for concepts with insufficient information to code with codable children: Secondary | ICD-10-CM | POA: Insufficient documentation

## 2013-08-14 LAB — ANAEROBIC AND AEROBIC CULTURE

## 2013-09-20 ENCOUNTER — Ambulatory Visit: Payer: BC Managed Care – PPO | Admitting: General Surgery

## 2013-11-24 ENCOUNTER — Encounter: Payer: Self-pay | Admitting: *Deleted

## 2013-11-29 ENCOUNTER — Encounter: Payer: Self-pay | Admitting: General Surgery

## 2014-05-09 ENCOUNTER — Emergency Department: Admit: 2014-05-09 | Disposition: A | Discharge status: Home / Self Care | Payer: Self-pay | Admitting: Student

## 2014-05-21 NOTE — Op Note (Signed)
PATIENT NAME:  Tracy Durham, Tracy Durham MR#:  [id number] DATE OF BIRTH:  Sep 05, 1950  DATE OF PROCEDURE:  06/28/2013  PREOPERATIVE DIAGNOSIS: Incisional hernia right lower quadrant.   POSTOPERATIVE DIAGNOSIS: Incisional hernia right lower quadrant.   OPERATIVE PROCEDURE: Repair of incisional hernia with Bard lightweight mesh.   SURGEON: Donnalee CurryJeffrey Lannette Avellino, MD   ANESTHESIA: General endotracheal under Dr. Henrene HawkingKephart.   ESTIMATED BLOOD LOSS: Less than 50 mL.   CLINICAL NOTE: This 64 year old woman had undergone an appendectomy through a paramedian incision. Postoperatively there was delayed wound healing. She has developed a ventral hernia which by CT contains the cecum and small bowel. She was felt to be a candidate for elective repair. She had discontinued smoking for 1 month prior to the procedure. She received Invanz for antibiotic prophylaxis as no intraoperative cultures were done at the time of her appendectomy in May of 2012.   OPERATIVE NOTE: With the patient under adequate general endotracheal anesthesia with TED and pneumatic compression stockings in place and with having received Invanz as noted above, she underwent general endotracheal anesthesia without difficulty. ChloraPrep was applied to the skin. The previous paramedian incision in the right lower quadrant was incised sharply. The remaining dissection was completed with electrocautery. The hernia sac was encountered and carefully dissected from the adjacent adipose tissue. It was excised at the fascial level and discarded. The leaves of the lateral abdominal wall muscles were separated to identify a space between the external oblique and the internal oblique/transversalis layer. The undersurface of the peritoneum was cleared. The peritoneum was then closed with a running 2-0 Vicryl. A 10 x 10-cm Bard soft mesh was then placed into the plane above the peritoneal/transverse abdominis closure and anchored in multiple locations with transfacial (through  the external oblique and lateral rectus sheath) 0 Surgilon sutures. The external oblique was then closed with interrupted 0 Surgilon figure-of-8 sutures. The adipose layer was closed in multiple layers with running 2-0 Vicryl. The dermal layer was approximated with interrupted 3-0 Vicryl, and the skin closed with a running 4-0 Vicryl subcuticular suture. Benzoin and Steri-Strips followed by Telfa and Tegaderm dressing was then applied.   The patient tolerated the procedure well and was taken to the recovery room in stable condition.    ____________________________ Earline MayotteJeffrey W. Kemp Gomes, MD jwb:lt D: 06/28/2013 15:45:34 ET T: 06/28/2013 21:21:39 ET JOB#: [id number]  cc: Earline MayotteJeffrey W. Julius Boniface, MD, <Dictator> Margaretann LovelessNeelam Durham. Khan, MD Abbe Bula Brion AlimentW Keeva Reisen MD ELECTRONICALLY SIGNED 06/30/2013 9:26

## 2014-06-15 ENCOUNTER — Ambulatory Visit: Payer: Self-pay

## 2014-07-08 ENCOUNTER — Encounter: Payer: Self-pay | Admitting: *Deleted

## 2014-07-13 ENCOUNTER — Inpatient Hospital Stay: Payer: Self-pay

## 2016-03-27 ENCOUNTER — Other Ambulatory Visit: Payer: Self-pay | Admitting: Orthopedic Surgery

## 2016-03-27 DIAGNOSIS — M5136 Other intervertebral disc degeneration, lumbar region: Secondary | ICD-10-CM

## 2016-03-27 DIAGNOSIS — M545 Low back pain: Secondary | ICD-10-CM

## 2016-04-10 ENCOUNTER — Ambulatory Visit
Admission: RE | Admit: 2016-04-10 | Discharge: 2016-04-10 | Disposition: A | Discharge status: Home / Self Care | Payer: BLUE CROSS/BLUE SHIELD | Source: Ambulatory Visit | Attending: Orthopedic Surgery | Admitting: Orthopedic Surgery

## 2016-04-10 DIAGNOSIS — M5136 Other intervertebral disc degeneration, lumbar region: Secondary | ICD-10-CM

## 2016-04-10 DIAGNOSIS — M545 Low back pain: Secondary | ICD-10-CM

## 2017-01-02 DIAGNOSIS — R509 Fever, unspecified: Secondary | ICD-10-CM | POA: Diagnosis not present

## 2017-01-02 DIAGNOSIS — J189 Pneumonia, unspecified organism: Secondary | ICD-10-CM | POA: Diagnosis not present

## 2017-01-02 DIAGNOSIS — R05 Cough: Secondary | ICD-10-CM | POA: Diagnosis not present

## 2017-08-13 ENCOUNTER — Telehealth: Payer: Self-pay | Admitting: Cardiology

## 2017-08-13 NOTE — Telephone Encounter (Signed)
Did not need this encounter °

## 2017-10-15 DIAGNOSIS — H9190 Unspecified hearing loss, unspecified ear: Secondary | ICD-10-CM | POA: Diagnosis not present

## 2017-10-15 DIAGNOSIS — H6123 Impacted cerumen, bilateral: Secondary | ICD-10-CM | POA: Diagnosis not present

## 2018-03-20 ENCOUNTER — Telehealth: Payer: Self-pay | Admitting: *Deleted

## 2018-03-20 NOTE — Telephone Encounter (Signed)
Entered in error

## 2018-06-18 ENCOUNTER — Other Ambulatory Visit: Payer: Self-pay | Admitting: Family Medicine

## 2018-06-18 DIAGNOSIS — Z1231 Encounter for screening mammogram for malignant neoplasm of breast: Secondary | ICD-10-CM

## 2018-10-08 IMAGING — MR MR LUMBAR SPINE W/O CM
5 series · 43 of 48 positions shown · non-contrast
Comparison: None available.

CLINICAL DATA: Initial evaluation for low back pain radiating down
posterior left thigh to knee for 5 weeks. Occasional tingling.

EXAM:
MRI LUMBAR SPINE WITHOUT CONTRAST
TECHNIQUE: Multiplanar, multisequence MR imaging of the lumbar spine was
performed. No intravenous contrast was administered.

[Series 3: T2 · sagittal · 4.0mm · 0.88mm/px · 6 of 13 slices shown (1 of 2)]
[im 1/13]
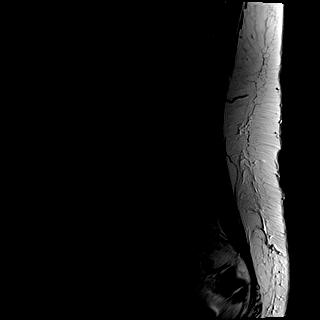
[im 3/13]
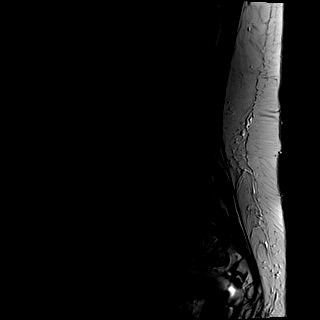
[im 5/13]
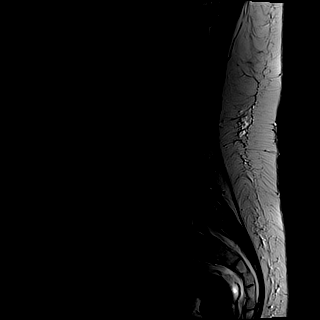
[im 8/13]
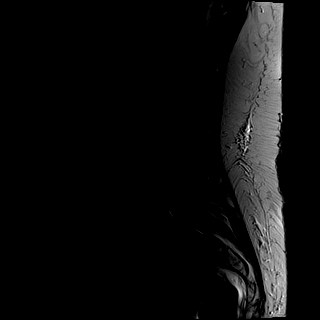
[im 10/13]
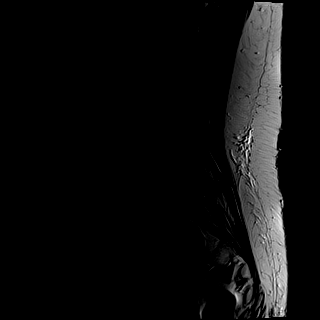
[im 13/13]
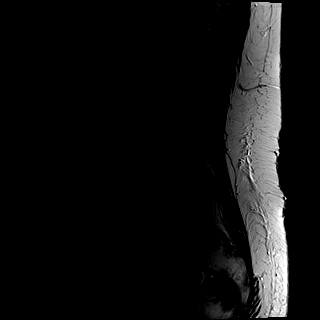

[Series 4: T1 · sagittal · 4.0mm · 0.88mm/px · 6 of 13 slices shown (1 of 2)]
[im 1/13]
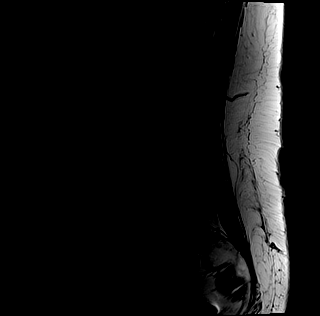
[im 3/13]
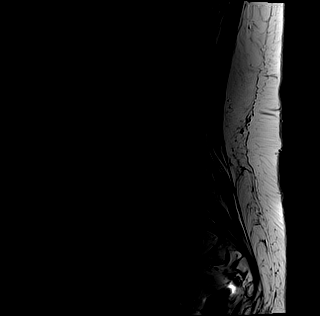
[im 5/13]
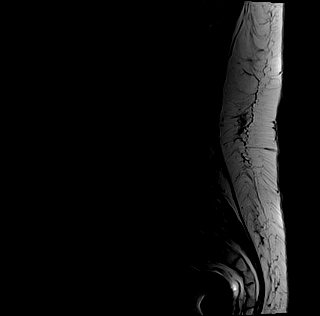
[im 8/13]
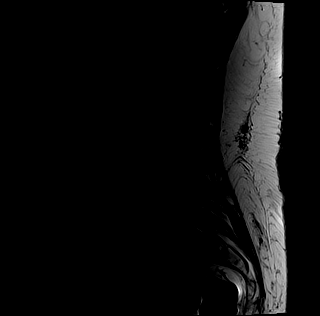
[im 10/13]
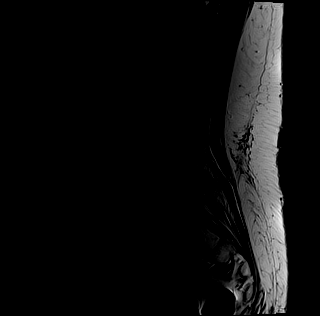
[im 13/13]
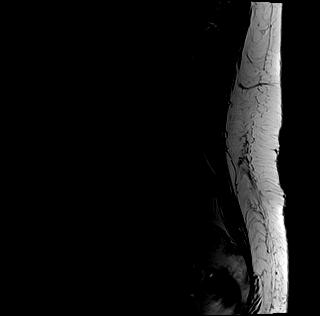

[Series 5: tirm sag · sagittal · 4.0mm · 0.55mm/px · 6 of 13 slices shown]
[im 1/13]
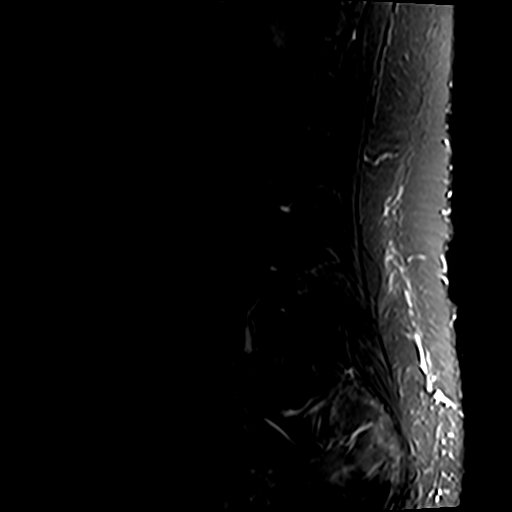
[im 3/13]
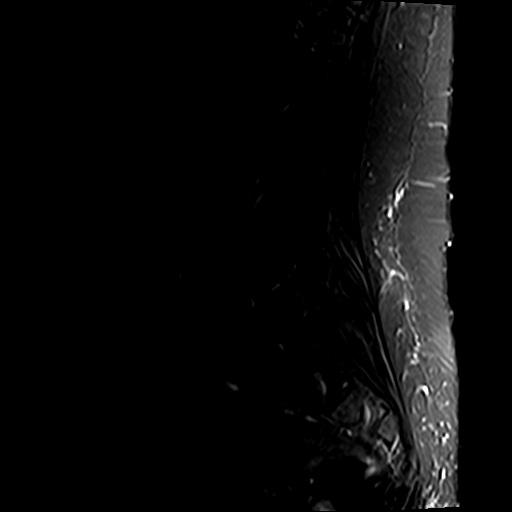
[im 5/13]
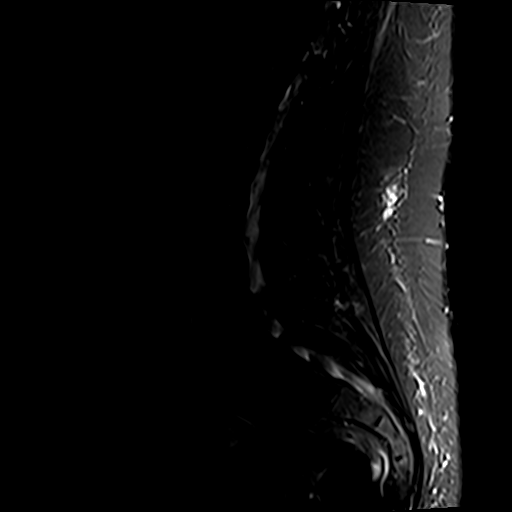
[im 8/13]
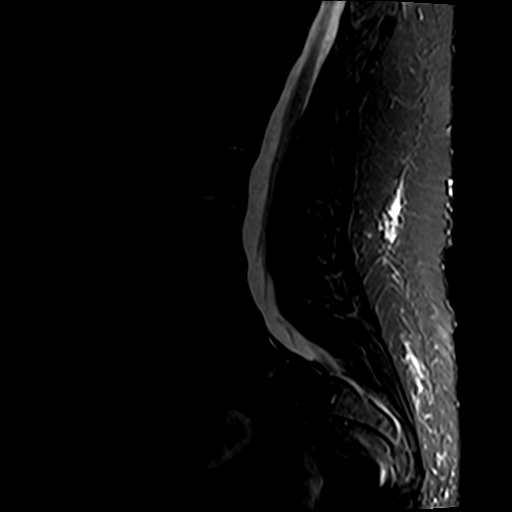
[im 10/13]
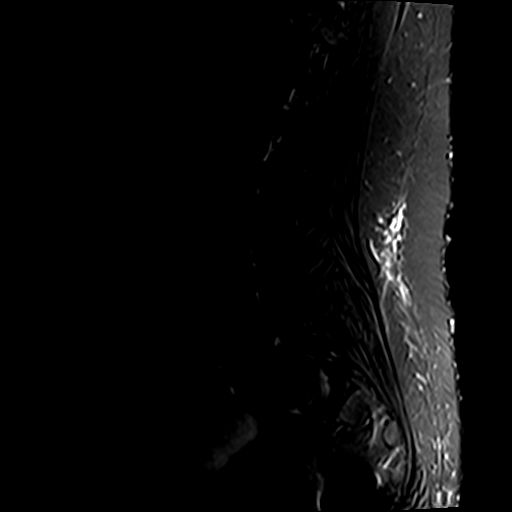
[im 13/13]
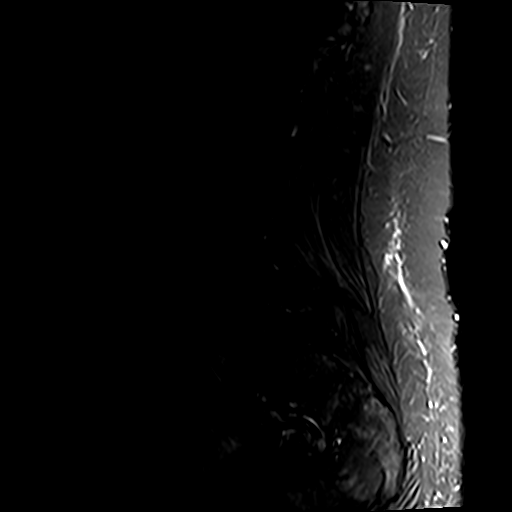

[Series 9: T1 · axial · 4.0mm · 0.78mm/px · z∈[-144,+48]mm · 10 of 31 slices shown (2 of 2)]
[im 1/31]
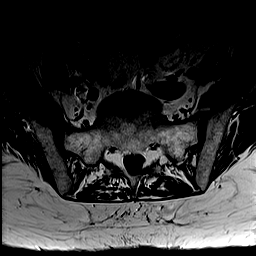
[im 3/31]
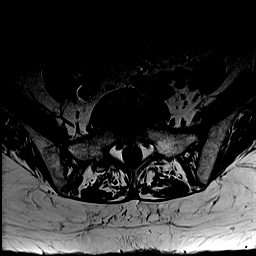
[im 5/31]
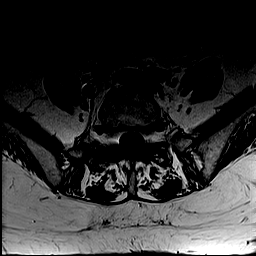
[im 9/31]
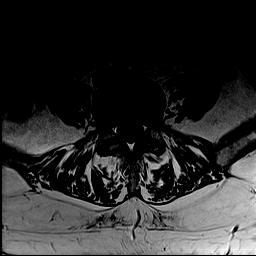
[im 13/31]
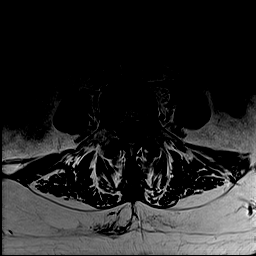
[im 16/31]
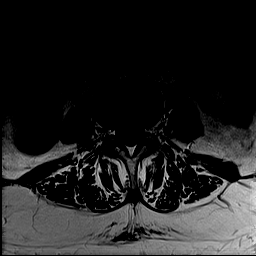
[im 18/31]
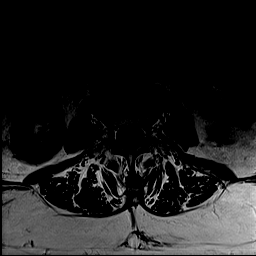
[im 22/31]
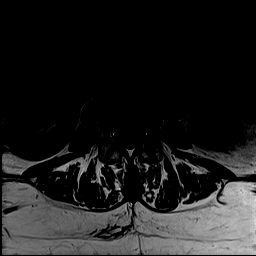
[im 26/31]
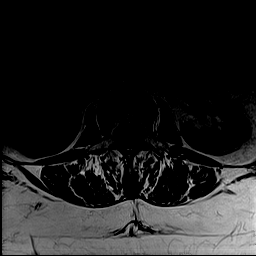
[im 31/31]
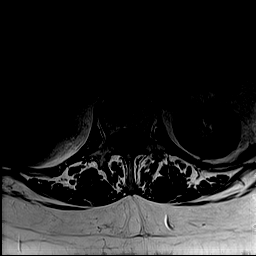

[Series 11: T2 · axial · 4.0mm · 0.78mm/px · z∈[-144,+48]mm · 15 of 31 slices shown (2 of 2)]
[im 1/31]
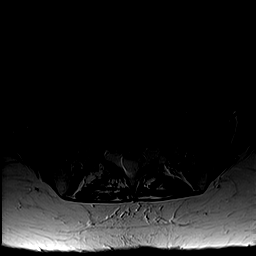
[im 3/31]
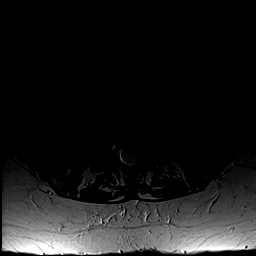
[im 5/31]
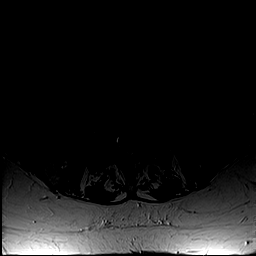
[im 7/31]
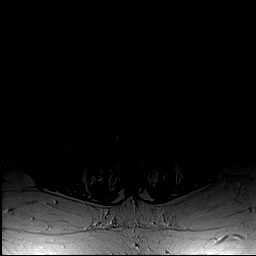
[im 9/31]
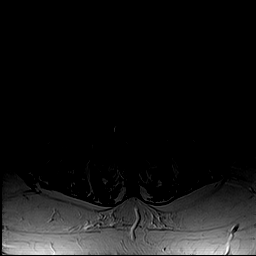
[im 11/31]
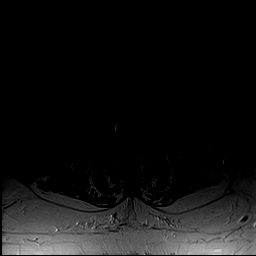
[im 13/31]
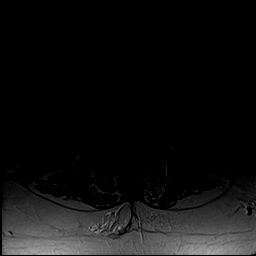
[im 16/31]
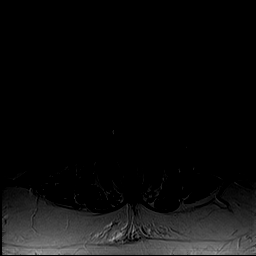
[im 18/31]
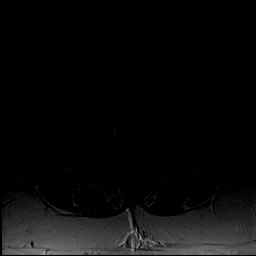
[im 20/31]
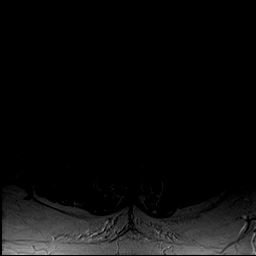
[im 22/31]
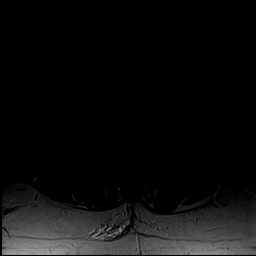
[im 24/31]
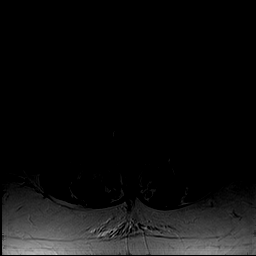
[im 26/31]
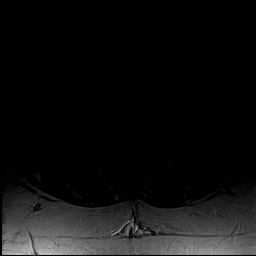
[im 28/31]
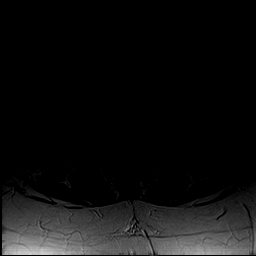
[im 31/31]
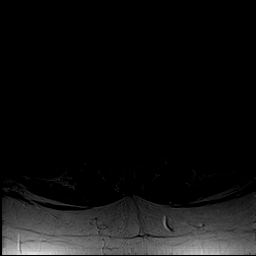

[43 of 48 positions shown; findings below may reference images not displayed]

FINDINGS: Segmentation: Normal segmentation. Lowest well-formed disc is
labeled the L5-S1 level.

Alignment: Chronic 3 mm anterolisthesis of L5 on S1. Vertebral
bodies otherwise normally aligned with preservation of the normal
lumbar lordosis.

Vertebrae: Vertebral body heights are maintained. No evidence for
acute or chronic fracture. Signal intensity within the vertebral
body bone marrow is normal. No abnormal marrow edema. No discrete
osseous lesions.

Conus medullaris: Extends to the L1 level and appears normal.

Paraspinal and other soft tissues: Paraspinous soft tissues within
normal limits. Visualized visceral structures within normal limits.

Disc levels:

L1-2:  Unremarkable.

L2-3:  Unremarkable.

L3-4: Mild degenerative disc bulge with disc desiccation. No focal
disc protrusion. No significant canal or foraminal stenosis.

L4-5: Mild diffuse degenerative disc bulge with disc desiccation.
Minimal facet degeneration. No canal or foraminal stenosis.

L5-S1: 3 mm anterolisthesis of L5 on S1. Associated mild diffuse
disc bulge with disc desiccation. Bilateral facet arthrosis. No
significant canal or foraminal stenosis.
IMPRESSION: 1. 3 mm chronic anterolisthesis of L5 on S1 with associated moderate
facet arthrosis. No significant stenosis or evidence for neural
impingement.
2. Mild degenerative disc bulging at L3-4 and L4-5 without stenosis
or neural impingement.
3. Otherwise negative MRI of the lumbar spine. No other significant
degenerative changes identified.

## 2021-07-09 ENCOUNTER — Other Ambulatory Visit: Payer: Self-pay | Admitting: Pediatrics

## 2021-07-09 DIAGNOSIS — Z1231 Encounter for screening mammogram for malignant neoplasm of breast: Secondary | ICD-10-CM

## 2021-08-14 ENCOUNTER — Other Ambulatory Visit: Payer: Self-pay | Admitting: Internal Medicine

## 2021-08-14 DIAGNOSIS — R079 Chest pain, unspecified: Secondary | ICD-10-CM

## 2021-08-14 DIAGNOSIS — I208 Other forms of angina pectoris: Secondary | ICD-10-CM

## 2021-08-17 ENCOUNTER — Telehealth (HOSPITAL_COMMUNITY): Payer: Self-pay | Admitting: *Deleted

## 2021-08-17 NOTE — Telephone Encounter (Signed)
Reaching out to patient to offer assistance regarding upcoming cardiac imaging study; pt verbalizes understanding of appt date/time, parking situation and where to check in, pre-test NPO status and verified current allergies; name and call back number provided for further questions should they arise  Larey Brick RN Navigator Cardiac Imaging [facility] [phone] office [phone] cell  Patient will take 12.5mg  carvedilol two hours prior to her cardiac CT scan.

## 2021-08-20 ENCOUNTER — Ambulatory Visit
Admission: RE | Admit: 2021-08-20 | Discharge: 2021-08-20 | Disposition: A | Discharge status: Home / Self Care | Payer: Medicare Other | Source: Ambulatory Visit | Attending: Internal Medicine | Admitting: Internal Medicine

## 2021-08-20 DIAGNOSIS — R079 Chest pain, unspecified: Secondary | ICD-10-CM | POA: Diagnosis present

## 2021-08-20 DIAGNOSIS — I208 Other forms of angina pectoris: Secondary | ICD-10-CM | POA: Insufficient documentation

## 2021-08-20 MED ORDER — NITROGLYCERIN 0.4 MG SL SUBL
SUBLINGUAL_TABLET | SUBLINGUAL | Status: AC
  Filled 2021-08-20: qty 1

## 2021-08-20 MED ORDER — NITROGLYCERIN 0.4 MG SL SUBL
0.8000 mg | SUBLINGUAL_TABLET | Freq: Once | SUBLINGUAL | Status: AC
  Administered 2021-08-20: 0.4 mg via SUBLINGUAL

## 2021-08-20 MED ORDER — IOHEXOL 350 MG/ML SOLN
100.0000 mL | Freq: Once | INTRAVENOUS | Status: AC | PRN
  Administered 2021-08-20: 100 mL via INTRAVENOUS

## 2021-08-20 NOTE — Progress Notes (Signed)
Patient tolerated procedure well. Ambulate w/o difficulty. Denies any lightheadedness or being dizzy. Pt denies any pain at this time. Sitting in chair, drinking water provided. Pt is encouraged to drink additional water throughout the day and reason explained to patient. Patient verbalized understanding and all questions answered. ABC intact. No further needs at this time. Discharge from procedure area w/o issues.  

## 2022-01-02 ENCOUNTER — Ambulatory Visit
Admission: RE | Admit: 2022-01-02 | Discharge: 2022-01-02 | Disposition: A | Discharge status: Home / Self Care | Payer: Medicare Other | Source: Ambulatory Visit | Attending: Pediatrics | Admitting: Pediatrics

## 2022-01-02 ENCOUNTER — Ambulatory Visit
Admission: RE | Admit: 2022-01-02 | Discharge: 2022-01-02 | Disposition: A | Discharge status: Home / Self Care | Payer: Medicare Other | Attending: Pediatrics | Admitting: Pediatrics

## 2022-01-02 ENCOUNTER — Other Ambulatory Visit: Payer: Self-pay | Admitting: Pediatrics

## 2022-01-02 DIAGNOSIS — L02612 Cutaneous abscess of left foot: Secondary | ICD-10-CM | POA: Diagnosis present

## 2022-01-02 DIAGNOSIS — L03032 Cellulitis of left toe: Secondary | ICD-10-CM | POA: Insufficient documentation

## 2022-01-03 ENCOUNTER — Other Ambulatory Visit (INDEPENDENT_AMBULATORY_CARE_PROVIDER_SITE_OTHER): Payer: Self-pay | Admitting: Nurse Practitioner

## 2022-01-03 DIAGNOSIS — L02612 Cutaneous abscess of left foot: Secondary | ICD-10-CM

## 2022-01-03 DIAGNOSIS — L819 Disorder of pigmentation, unspecified: Secondary | ICD-10-CM

## 2022-01-04 ENCOUNTER — Encounter (INDEPENDENT_AMBULATORY_CARE_PROVIDER_SITE_OTHER): Payer: Self-pay | Admitting: Vascular Surgery

## 2022-01-04 ENCOUNTER — Ambulatory Visit (INDEPENDENT_AMBULATORY_CARE_PROVIDER_SITE_OTHER): Payer: Medicare Other | Admitting: Vascular Surgery

## 2022-01-04 ENCOUNTER — Ambulatory Visit (INDEPENDENT_AMBULATORY_CARE_PROVIDER_SITE_OTHER): Payer: Self-pay

## 2022-01-04 VITALS — BP 156/87 | HR 59 | Ht <= 58 in | Wt 228.0 lb

## 2022-01-04 DIAGNOSIS — I7025 Atherosclerosis of native arteries of other extremities with ulceration: Secondary | ICD-10-CM

## 2022-01-04 DIAGNOSIS — L819 Disorder of pigmentation, unspecified: Secondary | ICD-10-CM

## 2022-01-04 DIAGNOSIS — R230 Cyanosis: Secondary | ICD-10-CM | POA: Insufficient documentation

## 2022-01-04 DIAGNOSIS — L02612 Cutaneous abscess of left foot: Secondary | ICD-10-CM | POA: Diagnosis not present

## 2022-01-04 DIAGNOSIS — I10 Essential (primary) hypertension: Secondary | ICD-10-CM | POA: Diagnosis not present

## 2022-01-04 MED ORDER — CLOPIDOGREL BISULFATE 75 MG PO TABS: 75.0000 mg | ORAL_TABLET | Freq: Every day | ORAL | 6 refills | Status: DC

## 2022-01-04 NOTE — Patient Instructions (Signed)
Angiogram, Care After This sheet gives you information about how to care for yourself after your procedure. Your health care provider may also give you more specific instructions. If you have problems or questions, contact your health care provider. What can I expect after the procedure? After the procedure, it is common to have: Bruising and tenderness at the catheter insertion area. A collection of blood (hematoma) at the insertion area. This may feel like a small lump under the skin at the insertion site. Follow these instructions at home: Insertion site care  Follow instructions from your health care provider about how to take care of your insertion site. Make sure you: Wash your hands with soap and water before and after you change your bandage (dressing). If soap and water are not available, use hand sanitizer. Change your dressing as told by your health care provider. Do not take baths, swim, or use a hot tub until your health care provider approves. You may shower 24-48 hours after the procedure, or as told by your health care provider. To clean the insertion site: Gently wash the area with plain soap and water. Pat the area dry with a clean towel. Do not rub the site. This may cause bleeding. Check your insertion site every day for signs of infection. Check for: Redness, swelling, or pain. Fluid or blood. Warmth. Pus or a bad smell. Do not apply powder or lotion to the site. Keep the site clean and dry. Activity Do not drive for 24 hours if you were given a sedative during your procedure. Rest as told by your health care provider, usually for 1-2 days. Do not lift anything that is heavier than 10 lb (4.5 kg), or the limit that you are told, until your health care provider says that it is safe. If the insertion site was in your leg, try to avoid stairs for a few days. Return to your normal activities as told by your health care provider, usually in about a week. Ask your health  care provider what activities are safe for you. General instructions  If your insertion site starts bleeding, lie flat and put pressure on the site. If the bleeding does not stop, get help right away. This is a medical emergency. Take over-the-counter and prescription medicines only as told by your health care provider. Drink enough fluid to keep your urine pale yellow. This helps flush the contrast dye from your body. Keep all follow-up visits as told by your health care provider. This is important. Contact a health care provider if: You have a fever or chills. You have redness, swelling, or pain around your insertion site. You have fluid or blood coming from your insertion site. Your insertion site feels warm to the touch. You have pus or a bad smell coming from your insertion site. You have more bruising around the insertion site. Get help right away if you have: A problem with the insertion area, such as: The area swells fast or bleeds even after you apply pressure. The area becomes pale, cool, tingly, or numb. Chest pain. Trouble breathing. A rash. Any symptoms of a stroke. "BE FAST" is an easy way to remember the main warning signs of a stroke: B - Balance. Signs are dizziness, sudden trouble walking, or loss of balance. E - Eyes. Signs are trouble seeing or a sudden change in vision. F - Face. Signs are sudden weakness or loss of feeling of the face, or the face or eyelid drooping on one side. A -  Arms. Signs are weakness or loss of feeling in an arm. This happens suddenly and usually on one side of the body. S - Speech. Signs are sudden trouble speaking, slurred speech, or trouble understanding what people say. T - Time. Time to call emergency services. Write down what time symptoms started. You have other signs of a stroke, such as: A sudden, severe headache with no known cause. Nausea or vomiting. Seizure. These symptoms may represent a serious problem that is an emergency.  Do not wait to see if the symptoms will go away. Get medical help right away. Call your local emergency services (911 in the [place] Do not drive yourself to the hospital. Summary It is common to have bruising and tenderness at the catheter insertion area. Do not take baths, swim, or use a hot tub until your health care provider approves. You may shower 24-48 hours after the procedure or as told. It is important to rest and drink plenty of fluids. If the insertion site bleeds, lie flat and put pressure on the site. If the bleeding continues, get help right away. This is a medical emergency. This information is not intended to replace advice given to you by your health care provider. Make sure you discuss any questions you have with your health care provider. Document Revised: 11/18/2018 Document Reviewed: 11/18/2018 Elsevier Patient Education  2023 [employer].

## 2022-01-04 NOTE — Assessment & Plan Note (Signed)
From significant LLE ischemia

## 2022-01-04 NOTE — Assessment & Plan Note (Signed)
Noninvasive studies were performed today.  The right ABI is 1.10 with triphasic waveforms and a normal right digital pressure 165.  Left ABI is 0.67 with very diminished waveforms and this may actually be falsely elevated.  Left digital pressure is undetectable.   This is a critical and limb threatening situation.  With a possible history of "irregular heartbeat", this could be an embolic phenomenon but her left lower extremity is ischemic and has been for several weeks.  Were going to get her on the schedule for angiogram on Monday, December 11.  I discussed the risks and benefits of the procedure.  I discussed that some lesions will require surgical therapy but if appropriate and possible, endovascular therapy will be performed at the same setting.  Discussed the reason and rationale for treatment.  I discussed that without treatment, limb loss would be expected.  Discussed that limb loss is possible even with treatment.  She voices her understanding and is agreeable to proceed.

## 2022-01-04 NOTE — Assessment & Plan Note (Signed)
blood pressure control important in reducing the progression of atherosclerotic disease. On appropriate oral medications.  

## 2022-01-04 NOTE — H&P (View-Only) (Signed)
  Patient ID: Tracy Durham, female   DOB: 02/27/1950, 71 y.o.   MRN: [id number]  Chief Complaint  Patient presents with   New Patient (Initial Visit)    Patient in today for ABI and infection and discoloration in her L great toe.     HPI Tracy Durham is a 71 y.o. female.  I am asked to see the patient by Dr. Behling for evaluation of ischemia of the left lower extremity.  Patient's been having issues now for a few weeks.  She has scabs on her left great toe and second toe has developed some degree of infection in these.  Her toes are markedly cyanotic and painful.  She is having intractable pain toes.  The wounds have progressed instead of improving.  She is having inability to walk due to the pain.  No right leg symptoms.  No clear cause or inciting event.  This is also the same foot that she injured as a child and damaged the toes as well as had a significant surgery and lower leg few years ago.  Noninvasive studies were performed today.  The right ABI is 1.10 with triphasic waveforms and a normal right digital pressure 165.  Left ABI is 0.67 with very diminished waveforms and this may actually be falsely elevated.  Left digital pressure is undetectable.   She has been told she has had an irregular heart rate, but just had a cardiac evaluation that was apparently unrevealing.    Past Medical History:  Diagnosis Date   Arthritis    Fibromyalgia    Fractured hip (HCC)    GERD (gastroesophageal reflux disease)    Hypertension    Irregular heart beat    Thyroid disease    VIN III (vulvar intraepithelial neoplasia III)     Past Surgical History:  Procedure Laterality Date   ABDOMINAL HYSTERECTOMY  1995   [facility]   APPENDECTOMY  Jun 21, 2010   acute appendicitis and periappendicitis   CESAREAN SECTION     CHOLECYSTECTOMY     gastric stapeling  1980's   HERNIA REPAIR  06/28/13     Family History  Problem Relation Age of Onset   Alcohol abuse Mother    Dementia Mother     Cancer Mother        ovarian   Diabetes Father    Hypertension Father    Stroke Father    Alcohol abuse Brother    Alcohol abuse Sister    Cancer Sister        cervical      Social History   Tobacco Use   Smoking status: Every Day    Packs/day: 0.50    Years: 30.00    Total pack years: 15.00    Types: Cigarettes    Last attempt to quit: 05/25/2013    Years since quitting: 8.6    Passive exposure: Never   Smokeless tobacco: Never  Substance Use Topics   Alcohol use: No   Drug use: No     Allergies  Allergen Reactions   Bactrim [Sulfamethoxazole-Trimethoprim] Hives   Biaxin [Clarithromycin] Hives   Ciprofloxacin Hives   Keflex [Cephalexin] Hives   Morphine And Related Hives   Sulfa Antibiotics Hives   Tramadol Hives    Current Outpatient Medications  Medication Sig Dispense Refill   amLODipine (NORVASC) 5 MG tablet Take 1 tablet by mouth daily.     clopidogrel (PLAVIX) 75 MG tablet Take 1 tablet (75 mg total) by   mouth daily. 30 tablet 6   diclofenac (VOLTAREN) 75 MG EC tablet Take 75 mg by mouth 2 (two) times daily.     DULoxetine (CYMBALTA) 60 MG capsule Take 60 mg by mouth daily.     levothyroxine (SYNTHROID, LEVOTHROID) 50 MCG tablet Take 1 tablet by mouth daily.     omeprazole (PRILOSEC) 40 MG capsule Take 1 capsule by mouth daily.     potassium chloride SA (K-DUR,KLOR-CON) 20 MEQ tablet 1/2 tab by mouth on Monday, Wednesday, and Friday.     chlorzoxazone (PARAFON) 500 MG tablet Take 1 tablet by mouth daily as needed.  (Patient not taking: Reported on 01/04/2022)     citalopram (CELEXA) 40 MG tablet Take 40 mg by mouth daily. (Patient not taking: Reported on 01/04/2022)     hydrochlorothiazide (MICROZIDE) 12.5 MG capsule Take 1 capsule by mouth daily. (Patient not taking: Reported on 01/04/2022)     HYDROcodone-acetaminophen (NORCO/VICODIN) 5-325 MG per tablet Take 1 tablet by mouth every 6 (six) hours as needed for moderate pain. (Patient not taking: Reported on  01/04/2022)     metoprolol (LOPRESSOR) 50 MG tablet Take 1 tablet by mouth daily. (Patient not taking: Reported on 01/04/2022)     naproxen (NAPROSYN) 500 MG tablet Take 1 tablet by mouth daily. (Patient not taking: Reported on 01/04/2022)     No current facility-administered medications for this visit.      REVIEW OF SYSTEMS (Negative unless checked)  Constitutional: [] Weight loss  [] Fever  [] Chills Cardiac: [] Chest pain   [] Chest pressure   [x] Palpitations   [] Shortness of breath when laying flat   [] Shortness of breath at rest   [] Shortness of breath with exertion. Vascular:  [x] Pain in legs with walking   [] Pain in legs at rest   [] Pain in legs when laying flat   [] Claudication   [] Pain in feet when walking  [x] Pain in feet at rest  [] Pain in feet when laying flat   [] History of DVT   [] Phlebitis   [] Swelling in legs   [] Varicose veins   [x] Non-healing ulcers Pulmonary:   [] Uses home oxygen   [] Productive cough   [] Hemoptysis   [] Wheeze  [] COPD   [] Asthma Neurologic:  [] Dizziness  [] Blackouts   [] Seizures   [] History of stroke   [] History of TIA  [] Aphasia   [] Temporary blindness   [] Dysphagia   [] Weakness or numbness in arms   [] Weakness or numbness in legs Musculoskeletal:  [x] Arthritis   [] Joint swelling   [x] Joint pain   [] Low back pain Hematologic:  [] Easy bruising  [] Easy bleeding   [] Hypercoagulable state   [] Anemic  [] Hepatitis Gastrointestinal:  [] Blood in stool   [] Vomiting blood  [x] Gastroesophageal reflux/heartburn   [] Abdominal pain Genitourinary:  [] Chronic kidney disease   [] Difficult urination  [] Frequent urination  [] Burning with urination   [] Hematuria Skin:  [] Rashes   [x] Ulcers   [x] Wounds Psychological:  [] History of anxiety   []  History of major depression.    Physical Exam BP (!) 156/87 (BP Location: Right Arm, Patient Position: Sitting, Cuff Size: Large)   Pulse (!) 59   Ht 4\' 10"  (1.473 m)   Wt 228 lb (103.4 kg)   BMI 47.65 kg/m  Gen:  WD/WN, NAD. Obese.   Head: [place]/AT, No temporalis wasting.  Ear/Nose/Throat: Hearing grossly intact, nares w/o erythema or drainage, oropharynx w/o Erythema/Exudate Eyes: Conjunctiva clear, sclera non-icteric  Neck: trachea midline.  No JVD.  Pulmonary:  Good air movement, respirations not labored, no use of accessory muscles  Cardiac: RRR, no JVD Vascular:  Vessel Right Left  Radial Palpable Palpable                          DP Palpable  Not palpable  PT Palpable  Not palpable   Gastrointestinal:. No masses, surgical incisions, or scars. Musculoskeletal: M/S 5/5 throughout.  Left foot and toes are clearly purple and cyanotic extremely sluggish capillary refill.  Wounds are present on the distal aspect of toes 1 and 2.  No edema Neurologic: Sensation grossly intact in extremities.  Symmetrical.  Speech is fluent. Motor exam as listed above. Psychiatric: Judgment intact, Mood & affect appropriate for pt's clinical situation. Dermatologic: No rashes or ulcers noted.  No cellulitis or open wounds.    Radiology VAS [place] ABI WITH/WO TBI  Result Date: 01/04/2022  LOWER EXTREMITY DOPPLER STUDY Patient Name:  VALARIE FARACE  Date of Exam:   01/04/2022 Medical Rec #: [id number]      Accession #:    [id number] Date of Birth: [birth year]      Patient Gender: F Patient Age:   30 years Exam Location:  [facility] Procedure:      VAS [place] ABI WITH/WO TBI Referring Phys: Festus Barren --------------------------------------------------------------------------------  Indications: Discoloration on toes  Performing Technologist: Debbe Bales RVS  Examination Guidelines: A complete evaluation includes at minimum, Doppler waveform signals and systolic blood pressure reading at the level of bilateral brachial, anterior tibial, and posterior tibial arteries, when vessel segments are accessible. Bilateral testing is considered an integral part of a complete examination. Photoelectric Plethysmograph (PPG) waveforms and toe  systolic pressure readings are included as required and additional duplex testing as needed. Limited examinations for reoccurring indications may be performed as noted.  ABI Findings: +---------+------------------+-----+---------+--------+ Right    Rt Pressure (mmHg)IndexWaveform Comment  +---------+------------------+-----+---------+--------+ Brachial 175                                      +---------+------------------+-----+---------+--------+ ATA      130               0.74 biphasic          +---------+------------------+-----+---------+--------+ PTA      192               1.10 triphasic         +---------+------------------+-----+---------+--------+ Great Toe165               0.94 Normal            +---------+------------------+-----+---------+--------+ +---------+------------------+-----+--------+-------+ Left     Lt Pressure (mmHg)IndexWaveformComment +---------+------------------+-----+--------+-------+ Brachial 172                                    +---------+------------------+-----+--------+-------+ ATA      77                0.44                 +---------+------------------+-----+--------+-------+ PTA      117               0.67                 +---------+------------------+-----+--------+-------+ Great Toe0                 0.00                 +---------+------------------+-----+--------+-------+ +-------+-----------+-----------+------------+------------+  ABI/TBIToday's ABIToday's TBIPrevious ABIPrevious TBI +-------+-----------+-----------+------------+------------+ Right  1.10       .94                                 +-------+-----------+-----------+------------+------------+ Left   .67        0                                   +-------+-----------+-----------+------------+------------+ TOES Findings: +----------+---------------+--------+-------+ Right ToesPressure (mmHg)WaveformComment  +----------+---------------+--------+-------+ 1st Digit 45                             +----------+---------------+--------+-------+ 2nd Digit 49                             +----------+---------------+--------+-------+ 3rd Digit 49                             +----------+---------------+--------+-------+ 4th Digit 47                             +----------+---------------+--------+-------+ 5th Digit 31                             +----------+---------------+--------+-------+  +---------+---------------+--------+-------+ Left ToesPressure (mmHg)WaveformComment +---------+---------------+--------+-------+ 1st Digit24                             +---------+---------------+--------+-------+ 2nd Digit30                             +---------+---------------+--------+-------+ 3rd Digit4                              +---------+---------------+--------+-------+ 4th Digit20                             +---------+---------------+--------+-------+ 5th Digit16                             +---------+---------------+--------+-------+    Summary: Right: Resting right ankle-brachial index is within normal range. The right toe-brachial index is normal. Left: Resting left ankle-brachial index indicates moderate left lower extremity arterial disease. The left toe-brachial index is abnormal. *See table(s) above for measurements and observations.  Electronically signed by Leotis Pain MD on 01/04/2022 at 12:09:54 PM.    Final    DG Foot Complete Left  Result Date: 01/02/2022 CLINICAL DATA:  Pain, left great toe infection EXAM: LEFT FOOT - COMPLETE 3+ VIEW COMPARISON:  None Available. FINDINGS: There is no evidence of fracture or dislocation. Moderate plantar calcaneal spur. Mild first metatarsophalangeal arthrosis. Soft tissue edema of the great toe. IMPRESSION: 1. No fracture or dislocation of the left foot. No radiographic findings to suggest osteomyelitis. 2. Soft tissue edema  of the great toe. Electronically Signed   By: Delanna Ahmadi M.D.   On: 01/02/2022 10:46    Labs No results found for this or any previous visit (from the past 2160 hour(s)).  Assessment/Plan:  Atherosclerosis of native arteries of the extremities with ulceration ([place]) Noninvasive studies were performed today.  The right ABI is 1.10 with triphasic waveforms and a normal right digital pressure 165.  Left ABI is 0.67 with very diminished waveforms and this may actually be falsely elevated.  Left digital pressure is undetectable.   This is a critical and limb threatening situation.  With a possible history of "irregular heartbeat", this could be an embolic phenomenon but her left lower extremity is ischemic and has been for several weeks.  Were going to get her on the schedule for angiogram on Monday, December 11.  I discussed the risks and benefits of the procedure.  I discussed that some lesions will require surgical therapy but if appropriate and possible, endovascular therapy will be performed at the same setting.  Discussed the reason and rationale for treatment.  I discussed that without treatment, limb loss would be expected.  Discussed that limb loss is possible even with treatment.  She voices her understanding and is agreeable to proceed.  Essential hypertension blood pressure control important in reducing the progression of atherosclerotic disease. On appropriate oral medications.   Extremity cyanosis From significant LLE ischemia      Leotis Pain 01/04/2022, 12:50 PM   This note was created with Dragon medical transcription system.  Any errors from dictation are unintentional.

## 2022-01-04 NOTE — Progress Notes (Signed)
Patient ID: Tracy Durham, female   DOB: 1950-08-19, 71 y.o.   MRN: [id number]  Chief Complaint  Patient presents with   New Patient (Initial Visit)    Patient in today for ABI and infection and discoloration in her L great toe.     HPI Tracy Durham is a 71 y.o. female.  I am asked to see the patient by Dr. Janene Harvey for evaluation of ischemia of the left lower extremity.  Patient's been having issues now for a few weeks.  She has scabs on her left great toe and second toe has developed some degree of infection in these.  Her toes are markedly cyanotic and painful.  She is having intractable pain toes.  The wounds have progressed instead of improving.  She is having inability to walk due to the pain.  No right leg symptoms.  No clear cause or inciting event.  This is also the same foot that she injured as a child and damaged the toes as well as had a significant surgery and lower leg few years ago.  Noninvasive studies were performed today.  The right ABI is 1.10 with triphasic waveforms and a normal right digital pressure 165.  Left ABI is 0.67 with very diminished waveforms and this may actually be falsely elevated.  Left digital pressure is undetectable.   She has been told she has had an irregular heart rate, but just had a cardiac evaluation that was apparently unrevealing.    Past Medical History:  Diagnosis Date   Arthritis    Fibromyalgia    Fractured hip (HCC)    GERD (gastroesophageal reflux disease)    Hypertension    Irregular heart beat    Thyroid disease    VIN III (vulvar intraepithelial neoplasia III)     Past Surgical History:  Procedure Laterality Date   ABDOMINAL HYSTERECTOMY  1995   [facility]   APPENDECTOMY  Jun 21, 2010   acute appendicitis and periappendicitis   CESAREAN SECTION     CHOLECYSTECTOMY     gastric stapeling  1980's   HERNIA REPAIR  06/28/13     Family History  Problem Relation Age of Onset   Alcohol abuse Mother    Dementia Mother     Cancer Mother        ovarian   Diabetes Father    Hypertension Father    Stroke Father    Alcohol abuse Brother    Alcohol abuse Sister    Cancer Sister        cervical      Social History   Tobacco Use   Smoking status: Every Day    Packs/day: 0.50    Years: 30.00    Total pack years: 15.00    Types: Cigarettes    Last attempt to quit: 05/25/2013    Years since quitting: 8.6    Passive exposure: Never   Smokeless tobacco: Never  Substance Use Topics   Alcohol use: No   Drug use: No     Allergies  Allergen Reactions   Bactrim [Sulfamethoxazole-Trimethoprim] Hives   Biaxin [Clarithromycin] Hives   Ciprofloxacin Hives   Keflex [Cephalexin] Hives   Morphine And Related Hives   Sulfa Antibiotics Hives   Tramadol Hives    Current Outpatient Medications  Medication Sig Dispense Refill   amLODipine (NORVASC) 5 MG tablet Take 1 tablet by mouth daily.     clopidogrel (PLAVIX) 75 MG tablet Take 1 tablet (75 mg total) by  mouth daily. 30 tablet 6   diclofenac (VOLTAREN) 75 MG EC tablet Take 75 mg by mouth 2 (two) times daily.     DULoxetine (CYMBALTA) 60 MG capsule Take 60 mg by mouth daily.     levothyroxine (SYNTHROID, LEVOTHROID) 50 MCG tablet Take 1 tablet by mouth daily.     omeprazole (PRILOSEC) 40 MG capsule Take 1 capsule by mouth daily.     potassium chloride SA (K-DUR,KLOR-CON) 20 MEQ tablet 1/2 tab by mouth on Monday, Wednesday, and Friday.     chlorzoxazone (PARAFON) 500 MG tablet Take 1 tablet by mouth daily as needed.  (Patient not taking: Reported on 01/04/2022)     citalopram (CELEXA) 40 MG tablet Take 40 mg by mouth daily. (Patient not taking: Reported on 01/04/2022)     hydrochlorothiazide (MICROZIDE) 12.5 MG capsule Take 1 capsule by mouth daily. (Patient not taking: Reported on 01/04/2022)     HYDROcodone-acetaminophen (NORCO/VICODIN) 5-325 MG per tablet Take 1 tablet by mouth every 6 (six) hours as needed for moderate pain. (Patient not taking: Reported on  01/04/2022)     metoprolol (LOPRESSOR) 50 MG tablet Take 1 tablet by mouth daily. (Patient not taking: Reported on 01/04/2022)     naproxen (NAPROSYN) 500 MG tablet Take 1 tablet by mouth daily. (Patient not taking: Reported on 01/04/2022)     No current facility-administered medications for this visit.      REVIEW OF SYSTEMS (Negative unless checked)  Constitutional: [] Weight loss  [] Fever  [] Chills Cardiac: [] Chest pain   [] Chest pressure   [x] Palpitations   [] Shortness of breath when laying flat   [] Shortness of breath at rest   [] Shortness of breath with exertion. Vascular:  [x] Pain in legs with walking   [] Pain in legs at rest   [] Pain in legs when laying flat   [] Claudication   [] Pain in feet when walking  [x] Pain in feet at rest  [] Pain in feet when laying flat   [] History of DVT   [] Phlebitis   [] Swelling in legs   [] Varicose veins   [x] Non-healing ulcers Pulmonary:   [] Uses home oxygen   [] Productive cough   [] Hemoptysis   [] Wheeze  [] COPD   [] Asthma Neurologic:  [] Dizziness  [] Blackouts   [] Seizures   [] History of stroke   [] History of TIA  [] Aphasia   [] Temporary blindness   [] Dysphagia   [] Weakness or numbness in arms   [] Weakness or numbness in legs Musculoskeletal:  [x] Arthritis   [] Joint swelling   [x] Joint pain   [] Low back pain Hematologic:  [] Easy bruising  [] Easy bleeding   [] Hypercoagulable state   [] Anemic  [] Hepatitis Gastrointestinal:  [] Blood in stool   [] Vomiting blood  [x] Gastroesophageal reflux/heartburn   [] Abdominal pain Genitourinary:  [] Chronic kidney disease   [] Difficult urination  [] Frequent urination  [] Burning with urination   [] Hematuria Skin:  [] Rashes   [x] Ulcers   [x] Wounds Psychological:  [] History of anxiety   []  History of major depression.    Physical Exam BP (!) 156/87 (BP Location: Right Arm, Patient Position: Sitting, Cuff Size: Large)   Pulse (!) 59   Ht 4\' 10"  (1.473 m)   Wt 228 lb (103.4 kg)   BMI 47.65 kg/m  Gen:  WD/WN, NAD. Obese.   Head: [place]/AT, No temporalis wasting.  Ear/Nose/Throat: Hearing grossly intact, nares w/o erythema or drainage, oropharynx w/o Erythema/Exudate Eyes: Conjunctiva clear, sclera non-icteric  Neck: trachea midline.  No JVD.  Pulmonary:  Good air movement, respirations not labored, no use of accessory muscles  Cardiac: RRR, no JVD Vascular:  Vessel Right Left  Radial Palpable Palpable                          DP Palpable  Not palpable  PT Palpable  Not palpable   Gastrointestinal:. No masses, surgical incisions, or scars. Musculoskeletal: M/S 5/5 throughout.  Left foot and toes are clearly purple and cyanotic extremely sluggish capillary refill.  Wounds are present on the distal aspect of toes 1 and 2.  No edema Neurologic: Sensation grossly intact in extremities.  Symmetrical.  Speech is fluent. Motor exam as listed above. Psychiatric: Judgment intact, Mood & affect appropriate for pt's clinical situation. Dermatologic: No rashes or ulcers noted.  No cellulitis or open wounds.    Radiology VAS [place] ABI WITH/WO TBI  Result Date: 01/04/2022  LOWER EXTREMITY DOPPLER STUDY Patient Name:  Tracy Durham  Date of Exam:   01/04/2022 Medical Rec #: [id number]      Accession #:    [id number] Date of Birth: [birth year]      Patient Gender: F Patient Age:   30 years Exam Location:  [facility] Procedure:      VAS [place] ABI WITH/WO TBI Referring Phys: Festus Barren --------------------------------------------------------------------------------  Indications: Discoloration on toes  Performing Technologist: Debbe Bales RVS  Examination Guidelines: A complete evaluation includes at minimum, Doppler waveform signals and systolic blood pressure reading at the level of bilateral brachial, anterior tibial, and posterior tibial arteries, when vessel segments are accessible. Bilateral testing is considered an integral part of a complete examination. Photoelectric Plethysmograph (PPG) waveforms and toe  systolic pressure readings are included as required and additional duplex testing as needed. Limited examinations for reoccurring indications may be performed as noted.  ABI Findings: +---------+------------------+-----+---------+--------+ Right    Rt Pressure (mmHg)IndexWaveform Comment  +---------+------------------+-----+---------+--------+ Brachial 175                                      +---------+------------------+-----+---------+--------+ ATA      130               0.74 biphasic          +---------+------------------+-----+---------+--------+ PTA      192               1.10 triphasic         +---------+------------------+-----+---------+--------+ Great Toe165               0.94 Normal            +---------+------------------+-----+---------+--------+ +---------+------------------+-----+--------+-------+ Left     Lt Pressure (mmHg)IndexWaveformComment +---------+------------------+-----+--------+-------+ Brachial 172                                    +---------+------------------+-----+--------+-------+ ATA      77                0.44                 +---------+------------------+-----+--------+-------+ PTA      117               0.67                 +---------+------------------+-----+--------+-------+ Great Toe0                 0.00                 +---------+------------------+-----+--------+-------+ +-------+-----------+-----------+------------+------------+  ABI/TBIToday's ABIToday's TBIPrevious ABIPrevious TBI +-------+-----------+-----------+------------+------------+ Right  1.10       .94                                 +-------+-----------+-----------+------------+------------+ Left   .67        0                                   +-------+-----------+-----------+------------+------------+ TOES Findings: +----------+---------------+--------+-------+ Right ToesPressure (mmHg)WaveformComment  +----------+---------------+--------+-------+ 1st Digit 45                             +----------+---------------+--------+-------+ 2nd Digit 49                             +----------+---------------+--------+-------+ 3rd Digit 49                             +----------+---------------+--------+-------+ 4th Digit 47                             +----------+---------------+--------+-------+ 5th Digit 31                             +----------+---------------+--------+-------+  +---------+---------------+--------+-------+ Left ToesPressure (mmHg)WaveformComment +---------+---------------+--------+-------+ 1st Digit24                             +---------+---------------+--------+-------+ 2nd Digit30                             +---------+---------------+--------+-------+ 3rd Digit4                              +---------+---------------+--------+-------+ 4th Digit20                             +---------+---------------+--------+-------+ 5th Digit16                             +---------+---------------+--------+-------+    Summary: Right: Resting right ankle-brachial index is within normal range. The right toe-brachial index is normal. Left: Resting left ankle-brachial index indicates moderate left lower extremity arterial disease. The left toe-brachial index is abnormal. *See table(s) above for measurements and observations.  Electronically signed by Leotis Pain MD on 01/04/2022 at 12:09:54 PM.    Final    DG Foot Complete Left  Result Date: 01/02/2022 CLINICAL DATA:  Pain, left great toe infection EXAM: LEFT FOOT - COMPLETE 3+ VIEW COMPARISON:  None Available. FINDINGS: There is no evidence of fracture or dislocation. Moderate plantar calcaneal spur. Mild first metatarsophalangeal arthrosis. Soft tissue edema of the great toe. IMPRESSION: 1. No fracture or dislocation of the left foot. No radiographic findings to suggest osteomyelitis. 2. Soft tissue edema  of the great toe. Electronically Signed   By: Delanna Ahmadi M.D.   On: 01/02/2022 10:46    Labs No results found for this or any previous visit (from the past 2160 hour(s)).  Assessment/Plan:  Atherosclerosis of native arteries of the extremities with ulceration ([place]) Noninvasive studies were performed today.  The right ABI is 1.10 with triphasic waveforms and a normal right digital pressure 165.  Left ABI is 0.67 with very diminished waveforms and this may actually be falsely elevated.  Left digital pressure is undetectable.   This is a critical and limb threatening situation.  With a possible history of "irregular heartbeat", this could be an embolic phenomenon but her left lower extremity is ischemic and has been for several weeks.  Were going to get her on the schedule for angiogram on Monday, December 11.  I discussed the risks and benefits of the procedure.  I discussed that some lesions will require surgical therapy but if appropriate and possible, endovascular therapy will be performed at the same setting.  Discussed the reason and rationale for treatment.  I discussed that without treatment, limb loss would be expected.  Discussed that limb loss is possible even with treatment.  She voices her understanding and is agreeable to proceed.  Essential hypertension blood pressure control important in reducing the progression of atherosclerotic disease. On appropriate oral medications.   Extremity cyanosis From significant LLE ischemia      Leotis Pain 01/04/2022, 12:50 PM   This note was created with Dragon medical transcription system.  Any errors from dictation are unintentional.

## 2022-01-07 ENCOUNTER — Encounter: Payer: Self-pay | Admitting: Vascular Surgery

## 2022-01-07 ENCOUNTER — Ambulatory Visit
Admission: RE | Admit: 2022-01-07 | Discharge: 2022-01-07 | Disposition: A | Discharge status: Home / Self Care | Payer: Medicare Other | Attending: Vascular Surgery | Admitting: Vascular Surgery

## 2022-01-07 ENCOUNTER — Encounter
Admission: RE | Disposition: A | Discharge status: Home / Self Care | Payer: Self-pay | Source: Home / Self Care | Attending: Vascular Surgery

## 2022-01-07 DIAGNOSIS — I10 Essential (primary) hypertension: Secondary | ICD-10-CM | POA: Insufficient documentation

## 2022-01-07 DIAGNOSIS — F17210 Nicotine dependence, cigarettes, uncomplicated: Secondary | ICD-10-CM | POA: Insufficient documentation

## 2022-01-07 DIAGNOSIS — I701 Atherosclerosis of renal artery: Secondary | ICD-10-CM

## 2022-01-07 DIAGNOSIS — R230 Cyanosis: Secondary | ICD-10-CM | POA: Insufficient documentation

## 2022-01-07 DIAGNOSIS — I70245 Atherosclerosis of native arteries of left leg with ulceration of other part of foot: Secondary | ICD-10-CM | POA: Insufficient documentation

## 2022-01-07 DIAGNOSIS — L97529 Non-pressure chronic ulcer of other part of left foot with unspecified severity: Secondary | ICD-10-CM | POA: Diagnosis not present

## 2022-01-07 DIAGNOSIS — I743 Embolism and thrombosis of arteries of the lower extremities: Secondary | ICD-10-CM | POA: Diagnosis not present

## 2022-01-07 DIAGNOSIS — L97909 Non-pressure chronic ulcer of unspecified part of unspecified lower leg with unspecified severity: Secondary | ICD-10-CM

## 2022-01-07 HISTORY — PX: LOWER EXTREMITY ANGIOGRAPHY: CATH118251

## 2022-01-07 LAB — CREATININE, SERUM
Creatinine, Ser: 0.97 mg/dL (ref 0.44–1.00)
GFR, Estimated: >60 mL/min (ref >60)

## 2022-01-07 LAB — BUN: BUN: 27 mg/dL — ABNORMAL HIGH (ref 8–23)

## 2022-01-07 SURGERY — LOWER EXTREMITY ANGIOGRAPHY
Anesthesia: Moderate Sedation | Site: Leg Lower | Laterality: Left

## 2022-01-07 MED ORDER — ASPIRIN 81 MG PO TBEC
DELAYED_RELEASE_TABLET | ORAL | Status: AC
  Filled 2022-01-07: qty 1

## 2022-01-07 MED ORDER — SODIUM CHLORIDE 0.9 % IV SOLN
INTRAVENOUS | Status: DC
Start: 2022-01-07 — End: 2022-01-07
  Administered 2022-01-07: 1000 mL via INTRAVENOUS

## 2022-01-07 MED ORDER — LABETALOL HCL 5 MG/ML IV SOLN: 10.0000 mg | INTRAVENOUS | Status: DC | PRN

## 2022-01-07 MED ORDER — VANCOMYCIN HCL 1500 MG/300ML IV SOLN
1500.0000 mg | INTRAVENOUS | Status: AC
  Administered 2022-01-07: 1500 mg via INTRAVENOUS
  Filled 2022-01-07: qty 300

## 2022-01-07 MED ORDER — DIPHENHYDRAMINE HCL 50 MG/ML IJ SOLN: 50.0000 mg | Freq: Once | INTRAMUSCULAR | Status: DC | PRN

## 2022-01-07 MED ORDER — CLOPIDOGREL BISULFATE 75 MG PO TABS: 75.0000 mg | ORAL_TABLET | Freq: Every day | ORAL | Status: DC

## 2022-01-07 MED ORDER — MIDAZOLAM HCL 2 MG/2ML IJ SOLN
INTRAMUSCULAR | Status: AC
  Filled 2022-01-07: qty 4

## 2022-01-07 MED ORDER — ATORVASTATIN CALCIUM 10 MG PO TABS: 10.0000 mg | ORAL_TABLET | Freq: Every day | ORAL | 11 refills | Status: AC

## 2022-01-07 MED ORDER — MIDAZOLAM HCL 2 MG/ML PO SYRP: 8.0000 mg | ORAL_SOLUTION | Freq: Once | ORAL | Status: DC | PRN

## 2022-01-07 MED ORDER — ACETAMINOPHEN 325 MG PO TABS: 650.0000 mg | ORAL_TABLET | ORAL | Status: DC | PRN

## 2022-01-07 MED ORDER — MIDAZOLAM HCL 2 MG/2ML IJ SOLN
INTRAMUSCULAR | Status: DC | PRN
  Administered 2022-01-07: 2 mg via INTRAVENOUS

## 2022-01-07 MED ORDER — SODIUM CHLORIDE 0.9 % IV SOLN: INTRAVENOUS | Status: DC

## 2022-01-07 MED ORDER — FENTANYL CITRATE (PF) 100 MCG/2ML IJ SOLN
INTRAMUSCULAR | Status: DC | PRN
  Administered 2022-01-07: 25 ug via INTRAVENOUS
  Administered 2022-01-07: 50 ug via INTRAVENOUS

## 2022-01-07 MED ORDER — CLOPIDOGREL BISULFATE 75 MG PO TABS
150.0000 mg | ORAL_TABLET | Freq: Once | ORAL | Status: AC
  Administered 2022-01-07: 150 mg via ORAL

## 2022-01-07 MED ORDER — HEPARIN SODIUM (PORCINE) 1000 UNIT/ML IJ SOLN
INTRAMUSCULAR | Status: DC | PRN
  Administered 2022-01-07: 5000 [IU] via INTRAVENOUS

## 2022-01-07 MED ORDER — HYDRALAZINE HCL 20 MG/ML IJ SOLN: 5.0000 mg | INTRAMUSCULAR | Status: DC | PRN

## 2022-01-07 MED ORDER — HYDROMORPHONE HCL 1 MG/ML IJ SOLN: 1.0000 mg | Freq: Once | INTRAMUSCULAR | Status: DC | PRN

## 2022-01-07 MED ORDER — ATORVASTATIN CALCIUM 10 MG PO TABS: 10.0000 mg | ORAL_TABLET | Freq: Every day | ORAL | Status: DC

## 2022-01-07 MED ORDER — METHYLPREDNISOLONE SODIUM SUCC 125 MG IJ SOLR: 125.0000 mg | Freq: Once | INTRAMUSCULAR | Status: DC | PRN

## 2022-01-07 MED ORDER — ONDANSETRON HCL 4 MG/2ML IJ SOLN: 4.0000 mg | Freq: Four times a day (QID) | INTRAMUSCULAR | Status: DC | PRN

## 2022-01-07 MED ORDER — SODIUM CHLORIDE 0.9 % IV BOLUS
INTRAVENOUS | Status: DC | PRN
  Administered 2022-01-07: 250 mL via INTRAVENOUS

## 2022-01-07 MED ORDER — CLOPIDOGREL BISULFATE 75 MG PO TABS
ORAL_TABLET | ORAL | Status: AC
  Filled 2022-01-07: qty 2

## 2022-01-07 MED ORDER — FAMOTIDINE 20 MG PO TABS: 40.0000 mg | ORAL_TABLET | Freq: Once | ORAL | Status: DC | PRN

## 2022-01-07 MED ORDER — ASPIRIN 81 MG PO TBEC
81.0000 mg | DELAYED_RELEASE_TABLET | Freq: Every day | ORAL | Status: DC
  Administered 2022-01-07: 81 mg via ORAL

## 2022-01-07 MED ORDER — SODIUM CHLORIDE 0.9% FLUSH: 3.0000 mL | INTRAVENOUS | Status: DC | PRN

## 2022-01-07 MED ORDER — SODIUM CHLORIDE 0.9 % IV SOLN: 250.0000 mL | INTRAVENOUS | Status: DC | PRN

## 2022-01-07 MED ORDER — HEPARIN SODIUM (PORCINE) 1000 UNIT/ML IJ SOLN
INTRAMUSCULAR | Status: AC
  Filled 2022-01-07: qty 10

## 2022-01-07 MED ORDER — ASPIRIN 81 MG PO TBEC: 81.0000 mg | DELAYED_RELEASE_TABLET | Freq: Every day | ORAL | 2 refills | Status: DC

## 2022-01-07 MED ORDER — CLOPIDOGREL BISULFATE 75 MG PO TABS: 75.0000 mg | ORAL_TABLET | Freq: Every day | ORAL | 6 refills | Status: DC

## 2022-01-07 MED ORDER — FENTANYL CITRATE (PF) 100 MCG/2ML IJ SOLN
INTRAMUSCULAR | Status: AC
  Filled 2022-01-07: qty 2

## 2022-01-07 MED ORDER — SODIUM CHLORIDE 0.9% FLUSH: 3.0000 mL | Freq: Two times a day (BID) | INTRAVENOUS | Status: DC

## 2022-01-07 SURGICAL SUPPLY — 15 items
BALLN LUTONIX 018 5X80X130 (BALLOONS) ×1
BALLOON LUTONIX 018 5X80X130 (BALLOONS) IMPLANT
CATH OMNI FLUSH 5F 65CM (CATHETERS) IMPLANT
CATH ROTAREX 135 6FR (CATHETERS) IMPLANT
CATH VERT 5X100 (CATHETERS) IMPLANT
GLIDEWIRE ADV .035X260CM (WIRE) IMPLANT
KIT ENCORE 26 ADVANTAGE (KITS) IMPLANT
PACK ANGIOGRAPHY (CUSTOM PROCEDURE TRAY) ×1 IMPLANT
SHEATH BRITE TIP 5FRX11 (SHEATH) IMPLANT
SHEATH PINNACLE ST 6F 45CM (SHEATH) IMPLANT
STENT VIABAHN 6X7.5X120 (Permanent Stent) IMPLANT
SYR MEDRAD MARK 7 150ML (SYRINGE) IMPLANT
TUBING CONTRAST HIGH PRESS 72 (TUBING) IMPLANT
WIRE G V18X300CM (WIRE) IMPLANT
WIRE GUIDERIGHT .035X150 (WIRE) IMPLANT

## 2022-01-07 NOTE — Interval H&P Note (Signed)
History and Physical Interval Note:  01/07/2022 10:27 AM  Tracy Durham  has presented today for surgery, with the diagnosis of LLE Angio   BARD   ASO w ulceration.  The various methods of treatment have been discussed with the patient and family. After consideration of risks, benefits and other options for treatment, the patient has consented to  Procedure(s): Lower Extremity Angiography (Left) as a surgical intervention.  The patient's history has been reviewed, patient examined, no change in status, stable for surgery.  I have reviewed the patient's chart and labs.  Questions were answered to the patient's satisfaction.     Festus Barren

## 2022-01-07 NOTE — Interval H&P Note (Signed)
History and Physical Interval Note:  01/07/2022 10:27 AM  Tracy Durham  has presented today for surgery, with the diagnosis of LLE Angio   BARD   ASO w ulceration.  The various methods of treatment have been discussed with the patient and family. After consideration of risks, benefits and other options for treatment, the patient has consented to  Procedure(s): Lower Extremity Angiography (Left) as a surgical intervention.  The patient's history has been reviewed, patient examined, no change in status, stable for surgery.  I have reviewed the patient's chart and labs.  Questions were answered to the patient's satisfaction.     Rithika Seel   

## 2022-01-07 NOTE — Op Note (Signed)
[facility]  Percutaneous Study/Intervention Procedural Note   Date of Surgery: 01/07/2022  Surgeon(s):Nili Honda    Assistants:none  Pre-operative Diagnosis: PAD with ulceration LLE  Post-operative diagnosis:  Same  Procedure(s) Performed:             1.  Ultrasound guidance for vascular access right femoral artery             2.  Catheter placement into left common femoral artery from left femoral approach             3.  Aortogram and selective left lower extremity angiogram             4.  Mechanical thrombectomy of the left SFA with the [place] Rex device             5.  Stent placement to the left SFA with 6 mm diameter by 7.5 cm length Viabahn stent  6.  StarClose closure device right femoral artery  EBL: 50 cc  Contrast: 50 cc  Fluoro Time: 4.5 minutes  Moderate Conscious Sedation Time: approximately 36 minutes using 2 mg of Versed and 75 mcg of Fentanyl              Indications:  Patient is a 71 y.o.female with ulceration and cyanosis in the left foot. The patient has noninvasive study showing reduced left ABI with undetectable digital pressures. The patient is brought in for angiography for further evaluation and potential treatment.  Due to the limb threatening nature of the situation, angiogram was performed for attempted limb salvage. The patient is aware that if the procedure fails, amputation would be expected.  The patient also understands that even with successful revascularization, amputation may still be required due to the severity of the situation. Risks and benefits are discussed and informed consent is obtained.   Procedure:  The patient was identified and appropriate procedural time out was performed.  The patient was then placed supine on the table and prepped and draped in the usual sterile fashion. Moderate conscious sedation was administered during a face to face encounter with the patient throughout the procedure with my supervision of  the RN administering medicines and monitoring the patient's vital signs, pulse oximetry, telemetry and mental status throughout from the start of the procedure until the patient was taken to the recovery room. Ultrasound was used to evaluate the right common femoral artery.  It was patent .  A digital ultrasound image was acquired.  A Seldinger needle was used to access the right common femoral artery under direct ultrasound guidance and a permanent image was performed.  A 0.035 J wire was advanced without resistance and a 5Fr sheath was placed.  Pigtail catheter was placed into the aorta and an AP aortogram was performed. This demonstrated normal right renal artery and what appeared to be a moderate left renal artery stenosis and normal aorta and iliac segments without significant stenosis. I then crossed the aortic bifurcation and advanced to the left femoral head. Selective left lower extremity angiogram was then performed. This demonstrated normal common femoral artery, profunda femoris artery, and proximal superficial femoral artery.  In the mid to distal SFA was a short segment high-grade stenosis with associated thrombus.  The popliteal artery normalized and then there was three-vessel runoff distally without significant tibial disease appreciated. It was felt that it was in the patient's best interest to proceed with intervention after these images to avoid a second procedure and a larger amount of  contrast and fluoroscopy based off of the findings from the initial angiogram. The patient was systemically heparinized and a 6 [place] Destination sheath was then placed over the [employer] wire. I then used a Kumpe catheter and the advantage wire to easily navigate through the SFA lesion and into the popliteal artery.  I then exchanged for a V18 wire which was parked in the posterior tibial artery.  I performed mechanical thrombectomy on the left SFA lesion to debulk the thrombus with 2 passes with the [place]  Rex device.  Following thrombectomy, there remained a high-grade residual stenosis but the thrombus was improved.  I elected to primarily stented due to the associated thrombus that had been present.  A 6 mm diameter by 7.5 cm length Viabahn stent was deployed in the left SFA and postdilated with a 5 mm diameter by 8 cm length Lutonix drug-coated balloon.  Completion imaging showed excellent angiographic result with less than 10% residual stenosis and preserved runoff distally. I elected to terminate the procedure. The sheath was removed and StarClose closure device was deployed in the right femoral artery with excellent hemostatic result. The patient was taken to the recovery room in stable condition having tolerated the procedure well.  Findings:               Aortogram:  This demonstrated normal right renal artery and what appeared to be a moderate left renal artery stenosis and normal aorta and iliac segments without significant stenosis.             Left Lower Extremity:  This demonstrated normal common femoral artery, profunda femoris artery, and proximal superficial femoral artery.  In the mid to distal SFA was a short segment high-grade stenosis with associated thrombus.  The popliteal artery normalized and then there was three-vessel runoff distally without significant tibial disease appreciated.   Disposition: Patient was taken to the recovery room in stable condition having tolerated the procedure well.  Complications: None  Festus Barren 01/07/2022 2:03 PM   This note was created with Dragon Medical transcription system. Any errors in dictation are purely unintentional.

## 2022-01-08 ENCOUNTER — Encounter: Payer: Self-pay | Admitting: Vascular Surgery

## 2022-01-09 ENCOUNTER — Telehealth (INDEPENDENT_AMBULATORY_CARE_PROVIDER_SITE_OTHER): Payer: Self-pay

## 2022-01-09 NOTE — Telephone Encounter (Signed)
Patient left a voicemail  informing that she is having allergic reaction with hives and itching. The patient thinks it may be from the Fentanyl. Patient had left le angio on 01/07/22. I spoke with Vivia Birmingham NP and she recommended for medrol dose pack to be called into pharmacy. Patient was made aware that medicine has been left on pharmacy voicemail.

## 2022-01-16 ENCOUNTER — Telehealth (INDEPENDENT_AMBULATORY_CARE_PROVIDER_SITE_OTHER): Payer: Self-pay

## 2022-01-16 ENCOUNTER — Encounter (INDEPENDENT_AMBULATORY_CARE_PROVIDER_SITE_OTHER): Payer: Self-pay | Admitting: Nurse Practitioner

## 2022-01-16 NOTE — Telephone Encounter (Signed)
Letter sent.

## 2022-02-05 ENCOUNTER — Other Ambulatory Visit (INDEPENDENT_AMBULATORY_CARE_PROVIDER_SITE_OTHER): Payer: Self-pay | Admitting: Vascular Surgery

## 2022-02-05 DIAGNOSIS — I739 Peripheral vascular disease, unspecified: Secondary | ICD-10-CM

## 2022-02-06 ENCOUNTER — Ambulatory Visit (INDEPENDENT_AMBULATORY_CARE_PROVIDER_SITE_OTHER): Payer: Medicare Other

## 2022-02-06 ENCOUNTER — Ambulatory Visit (INDEPENDENT_AMBULATORY_CARE_PROVIDER_SITE_OTHER): Payer: Medicare Other | Admitting: Nurse Practitioner

## 2022-02-06 ENCOUNTER — Encounter (INDEPENDENT_AMBULATORY_CARE_PROVIDER_SITE_OTHER): Payer: Self-pay | Admitting: Nurse Practitioner

## 2022-02-06 VITALS — BP 151/82 | HR 59 | Ht <= 58 in | Wt 225.0 lb

## 2022-02-06 DIAGNOSIS — I739 Peripheral vascular disease, unspecified: Secondary | ICD-10-CM

## 2022-02-06 DIAGNOSIS — I10 Essential (primary) hypertension: Secondary | ICD-10-CM

## 2022-02-06 DIAGNOSIS — Z9889 Other specified postprocedural states: Secondary | ICD-10-CM

## 2022-02-06 LAB — VAS US ABI WITH/WO TBI
Left ABI: 0.97
Right ABI: 1.07

## 2022-02-17 ENCOUNTER — Encounter (INDEPENDENT_AMBULATORY_CARE_PROVIDER_SITE_OTHER): Payer: Self-pay | Admitting: Nurse Practitioner

## 2022-02-17 NOTE — Progress Notes (Signed)
Subjective:    Patient ID: Tracy Durham, female    DOB: 11-29-1950, 72 y.o.   MRN: [id number] Chief Complaint  Patient presents with   Follow-up    4 week follow up with ABI    The patient returns to the office for followup and review status post angiogram with intervention on 01/07/2022.   Procedure: Procedure(s) Performed:             1.  Ultrasound guidance for vascular access right femoral artery             2.  Catheter placement into left common femoral artery from left femoral approach             3.  Aortogram and selective left lower extremity angiogram             4.  Mechanical thrombectomy of the left SFA with the Rota Rex device             5.  Stent placement to the left SFA with 6 mm diameter by 7.5 cm length Viabahn stent             6.  StarClose closure device right femoral artery     The patient notes improvement in the lower extremity symptoms. No interval shortening of the patient's claudication distance or rest pain symptoms. No new ulcers or wounds have occurred since the last visit.  There have been no significant changes to the patient's overall health care.  No documented history of amaurosis fugax or recent TIA symptoms. There are no recent neurological changes noted. No documented history of DVT, PE or superficial thrombophlebitis. The patient denies recent episodes of angina or shortness of breath.   ABI's Rt=1.07 and Lt=0.97  (previous ABI's Rt=1.10 and Lt=0.67) Duplex US of the triphasic/biphasic waveforms in the right with triphasic waveforms on the left and good toe waveforms bilaterally    Review of Systems  All other systems reviewed and are negative.      Objective:   Physical Exam Vitals reviewed.  HENT:     Head: Normocephalic.  Cardiovascular:     Rate and Rhythm: Normal rate.     Pulses:          Dorsalis pedis pulses are detected w/ Doppler on the right side and detected w/ Doppler on the left side.       Posterior tibial  pulses are detected w/ Doppler on the right side and detected w/ Doppler on the left side.  Pulmonary:     Effort: Pulmonary effort is normal.  Musculoskeletal:     Left lower leg: Edema present.  Skin:    General: Skin is warm and dry.  Neurological:     Mental Status: She is alert and oriented to person, place, and time.  Psychiatric:        Mood and Affect: Mood normal.        Behavior: Behavior normal.        Thought Content: Thought content normal.        Judgment: Judgment normal.     BP (!) 151/82   Pulse (!) 59   Ht 4\' 10"  (1.473 m)   Wt 225 lb (102.1 kg)   BMI 47.03 kg/m   Past Medical History:  Diagnosis Date   Arthritis    Fibromyalgia    Fractured hip (HCC)    GERD (gastroesophageal reflux disease)    Hypertension    Irregular heart beat  Thyroid disease    VIN III (vulvar intraepithelial neoplasia III)     Social History   Socioeconomic History   Marital status: Married    Spouse name: Not on file   Number of children: Not on file   Years of education: Not on file   Highest education level: Not on file  Occupational History   Not on file  Tobacco Use   Smoking status: Every Day    Packs/day: 0.50    Years: 30.00    Total pack years: 15.00    Types: Cigarettes    Last attempt to quit: 05/25/2013    Years since quitting: 8.7    Passive exposure: Never   Smokeless tobacco: Never  Vaping Use   Vaping Use: Never used  Substance and Sexual Activity   Alcohol use: No   Drug use: No   Sexual activity: Not on file  Other Topics Concern   Not on file  Social History Narrative   Not on file   Social Determinants of Health   Financial Resource Strain: Not on file  Food Insecurity: Not on file  Transportation Needs: Not on file  Physical Activity: Not on file  Stress: Not on file  Social Connections: Not on file  Intimate Partner Violence: Not on file    Past Surgical History:  Procedure Laterality Date   ABDOMINAL HYSTERECTOMY  1995    [facility]   APPENDECTOMY  Jun 21, 2010   acute appendicitis and periappendicitis   CESAREAN SECTION     CHOLECYSTECTOMY     gastric stapeling  1980's   HERNIA REPAIR  06/28/13   LOWER EXTREMITY ANGIOGRAPHY Left 01/07/2022   Procedure: Lower Extremity Angiography;  Surgeon: Annice Needyew, Jason S, MD;  Location: [facility];  Service: Cardiovascular;  Laterality: Left;    Family History  Problem Relation Age of Onset   Alcohol abuse Mother    Dementia Mother    Cancer Mother        ovarian   Diabetes Father    Hypertension Father    Stroke Father    Alcohol abuse Brother    Alcohol abuse Sister    Cancer Sister        cervical    Allergies  Allergen Reactions   Bactrim [Sulfamethoxazole-Trimethoprim] Hives   Biaxin [Clarithromycin] Hives   Ciprofloxacin Hives   Keflex [Cephalexin] Hives   Morphine And Related Hives   Sulfa Antibiotics Hives   Tramadol Hives       Latest Ref Rng & Units 06/22/2013    3:25 PM  CBC  WBC 3.6 - 11.0 x10 3/mm 3 5.7   Hemoglobin 12.0 - 16.0 g/dL 16.113.9   Hematocrit 09.635.0 - 47.0 % 41.5   Platelets 150 - 440 x10 3/mm 3 195       CMP     Component Value Date/Time   NA 139 06/22/2013 1525   K 4.2 06/22/2013 1525   CL 107 06/22/2013 1525   CO2 29 06/22/2013 1525   GLUCOSE 86 06/22/2013 1525   BUN 27 (H) 01/07/2022 1118   BUN 20 (H) 06/22/2013 1525   CREATININE 0.97 01/07/2022 1118   CREATININE 0.87 06/22/2013 1525   CALCIUM 8.9 06/22/2013 1525   GFRNONAA >60 01/07/2022 1118   GFRNONAA >60 06/22/2013 1525   GFRAA >60 06/22/2013 1525     VAS US ABI WITH/WO TBI  Result Date: 02/06/2022  LOWER EXTREMITY DOPPLER STUDY Patient Name:  Tracy FillerDebra S Kimmer  Date of Exam:  02/06/2022 Medical Rec #: [id number]      Accession #:    [id number] Date of Birth: 1951-01-29      Patient Gender: F Patient Age:   72 years Exam Location:  [facility] Procedure:      VAS [place] ABI WITH/WO TBI Referring Phys: JASON DEW  --------------------------------------------------------------------------------  Indications: Peripheral artery disease. High Risk Factors: Hypertension.  Vascular Interventions: 01/07/2022 Thrombectomy of left SFA with stent. Performing Technologist: Hardie Lora RVT  Examination Guidelines: A complete evaluation includes at minimum, Doppler waveform signals and systolic blood pressure reading at the level of bilateral brachial, anterior tibial, and posterior tibial arteries, when vessel segments are accessible. Bilateral testing is considered an integral part of a complete examination. Photoelectric Plethysmograph (PPG) waveforms and toe systolic pressure readings are included as required and additional duplex testing as needed. Limited examinations for reoccurring indications may be performed as noted.  ABI Findings: +---------+------------------+-----+---------+--------+ Right    Rt Pressure (mmHg)IndexWaveform Comment  +---------+------------------+-----+---------+--------+ Brachial 177                                      +---------+------------------+-----+---------+--------+ PTA      189               1.07 triphasic         +---------+------------------+-----+---------+--------+ DP       176               0.99 biphasic          +---------+------------------+-----+---------+--------+ Great Toe115               0.65                   +---------+------------------+-----+---------+--------+ +---------+------------------+-----+---------+-------+ Left     Lt Pressure (mmHg)IndexWaveform Comment +---------+------------------+-----+---------+-------+ Brachial 173                                     +---------+------------------+-----+---------+-------+ PTA      171               0.97 triphasic        +---------+------------------+-----+---------+-------+ DP       164               0.93 triphasic        +---------+------------------+-----+---------+-------+  Great Toe95                0.54                  +---------+------------------+-----+---------+-------+ +-------+-----------+-----------+------------+------------+ ABI/TBIToday's ABIToday's TBIPrevious ABIPrevious TBI +-------+-----------+-----------+------------+------------+ Right  1.07       0.65       1.10        0.94         +-------+-----------+-----------+------------+------------+ Left   0.97       0.54       0.67        0.00         +-------+-----------+-----------+------------+------------+  Right ABIs appear essentially unchanged compared to prior study on 01/04/2022. Left ABIs appear increased compared to prior study on 01/04/2022.  Summary: Right: Resting right ankle-brachial index is within normal range. The right toe-brachial index is abnormal. Left: Resting left ankle-brachial index is within normal range. The left toe-brachial index is abnormal. *See table(s) above for measurements and observations.  Electronically signed by Festus Barren MD on 02/06/2022 at 3:27:35 PM.    Final    VAS [place] ABI WITH/WO TBI  Result Date: 01/04/2022  LOWER EXTREMITY DOPPLER STUDY Patient Name:  LAVANNA ROG  Date of Exam:   01/04/2022 Medical Rec #: [id number]      Accession #:    [id number] Date of Birth: 07/11/1950      Patient Gender: F Patient Age:   78 years Exam Location:  [facility] Procedure:      VAS [place] ABI WITH/WO TBI Referring Phys: Festus Barren --------------------------------------------------------------------------------  Indications: Discoloration on toes  Performing Technologist: Debbe Bales RVS  Examination Guidelines: A complete evaluation includes at minimum, Doppler waveform signals and systolic blood pressure reading at the level of bilateral brachial, anterior tibial, and posterior tibial arteries, when vessel segments are accessible. Bilateral testing is considered an integral part of a complete examination. Photoelectric Plethysmograph (PPG) waveforms and toe  systolic pressure readings are included as required and additional duplex testing as needed. Limited examinations for reoccurring indications may be performed as noted.  ABI Findings: +---------+------------------+-----+---------+--------+ Right    Rt Pressure (mmHg)IndexWaveform Comment  +---------+------------------+-----+---------+--------+ Brachial 175                                      +---------+------------------+-----+---------+--------+ ATA      130               0.74 biphasic          +---------+------------------+-----+---------+--------+ PTA      192               1.10 triphasic         +---------+------------------+-----+---------+--------+ Great Toe165               0.94 Normal            +---------+------------------+-----+---------+--------+ +---------+------------------+-----+--------+-------+ Left     Lt Pressure (mmHg)IndexWaveformComment +---------+------------------+-----+--------+-------+ Brachial 172                                    +---------+------------------+-----+--------+-------+ ATA      77                0.44                 +---------+------------------+-----+--------+-------+ PTA      117               0.67                 +---------+------------------+-----+--------+-------+ Great Toe0                 0.00                 +---------+------------------+-----+--------+-------+ +-------+-----------+-----------+------------+------------+ ABI/TBIToday's ABIToday's TBIPrevious ABIPrevious TBI +-------+-----------+-----------+------------+------------+ Right  1.10       .94                                 +-------+-----------+-----------+------------+------------+ Left   .67        0                                   +-------+-----------+-----------+------------+------------+ TOES Findings: +----------+---------------+--------+-------+ Right  ToesPressure (mmHg)WaveformComment  +----------+---------------+--------+-------+ 1st Digit 45                             +----------+---------------+--------+-------+ 2nd Digit 49                             +----------+---------------+--------+-------+ 3rd Digit 49                             +----------+---------------+--------+-------+ 4th Digit 47                             +----------+---------------+--------+-------+ 5th Digit 31                             +----------+---------------+--------+-------+  +---------+---------------+--------+-------+ Left ToesPressure (mmHg)WaveformComment +---------+---------------+--------+-------+ 1st Digit24                             +---------+---------------+--------+-------+ 2nd Digit30                             +---------+---------------+--------+-------+ 3rd Digit4                              +---------+---------------+--------+-------+ 4th Digit20                             +---------+---------------+--------+-------+ 5th Digit16                             +---------+---------------+--------+-------+    Summary: Right: Resting right ankle-brachial index is within normal range. The right toe-brachial index is normal. Left: Resting left ankle-brachial index indicates moderate left lower extremity arterial disease. The left toe-brachial index is abnormal. *See table(s) above for measurements and observations.  Electronically signed by Leotis Pain MD on 01/04/2022 at 12:09:54 PM.    Final        Assessment & Plan:   1. Peripheral arterial disease with history of revascularization ([place]) Recommend:  The patient is status post successful angiogram with intervention.  The patient reports that the claudication symptoms and leg pain has improved.   The patient denies lifestyle limiting changes at this point in time.  No further invasive studies, angiography or surgery at this time The patient should continue walking and begin a more formal  exercise program.  The patient should continue antiplatelet therapy and aggressive treatment of the lipid abnormalities  Continued surveillance is indicated as atherosclerosis is likely to progress with time.    Patient should undergo noninvasive studies as ordered. The patient will follow up with me to review the studies.  - VAS [place] ABI WITH/WO TBI  2. Essential hypertension Continue antihypertensive medications as already ordered, these medications have been reviewed and there are no changes at this time.   Current Outpatient Medications on File Prior to Visit  Medication Sig Dispense Refill   amLODipine (NORVASC) 5 MG tablet Take 1 tablet by mouth daily.     atorvastatin (LIPITOR) 10 MG tablet Take 1 tablet (10 mg total) by mouth daily. 30 tablet 11   carvedilol (COREG) 6.25  MG tablet Take 6.25 mg by mouth 2 (two) times daily with a meal.     citalopram (CELEXA) 40 MG tablet Take 40 mg by mouth daily.     clopidogrel (PLAVIX) 75 MG tablet Take 1 tablet (75 mg total) by mouth daily. 30 tablet 6   DULoxetine (CYMBALTA) 60 MG capsule Take 60 mg by mouth daily.     famotidine (PEPCID) 20 MG tablet Take 1 tablet by mouth 2 (two) times daily.     levothyroxine (SYNTHROID, LEVOTHROID) 50 MCG tablet Take 1 tablet by mouth daily.     aspirin EC 81 MG tablet Take 1 tablet (81 mg total) by mouth daily. Swallow whole. 150 tablet 2   No current facility-administered medications on file prior to visit.    There are no Patient Instructions on file for this visit. No follow-ups on file.   Kris Hartmann, NP

## 2022-04-24 ENCOUNTER — Other Ambulatory Visit (INDEPENDENT_AMBULATORY_CARE_PROVIDER_SITE_OTHER): Payer: Self-pay | Admitting: Nurse Practitioner

## 2022-04-24 DIAGNOSIS — Z9889 Other specified postprocedural states: Secondary | ICD-10-CM

## 2022-05-07 ENCOUNTER — Ambulatory Visit (INDEPENDENT_AMBULATORY_CARE_PROVIDER_SITE_OTHER): Payer: Medicare Other | Admitting: Vascular Surgery

## 2022-05-07 ENCOUNTER — Encounter (INDEPENDENT_AMBULATORY_CARE_PROVIDER_SITE_OTHER): Payer: Medicare Other

## 2022-06-06 ENCOUNTER — Ambulatory Visit (INDEPENDENT_AMBULATORY_CARE_PROVIDER_SITE_OTHER): Payer: Medicare Other | Admitting: Nurse Practitioner

## 2022-06-06 ENCOUNTER — Ambulatory Visit (INDEPENDENT_AMBULATORY_CARE_PROVIDER_SITE_OTHER): Payer: Medicare Other

## 2022-06-06 VITALS — BP 140/78 | HR 69 | Resp 17 | Ht <= 58 in | Wt 220.4 lb

## 2022-06-06 DIAGNOSIS — I739 Peripheral vascular disease, unspecified: Secondary | ICD-10-CM

## 2022-06-06 DIAGNOSIS — Z9889 Other specified postprocedural states: Secondary | ICD-10-CM

## 2022-06-06 DIAGNOSIS — F17200 Nicotine dependence, unspecified, uncomplicated: Secondary | ICD-10-CM | POA: Diagnosis not present

## 2022-06-06 DIAGNOSIS — I10 Essential (primary) hypertension: Secondary | ICD-10-CM | POA: Diagnosis not present

## 2022-06-07 ENCOUNTER — Encounter (INDEPENDENT_AMBULATORY_CARE_PROVIDER_SITE_OTHER): Payer: Self-pay | Admitting: Nurse Practitioner

## 2022-06-07 NOTE — Progress Notes (Signed)
Subjective:    Patient ID: Tracy Durham, female    DOB: Sep 19, 1950, 72 y.o.   MRN: [id number] Chief Complaint  Patient presents with   Follow-up    The patient returns to the office for followup and review status post angiogram with intervention on 01/07/2022.   Procedure: Procedure(s) Performed:             1.  Ultrasound guidance for vascular access right femoral artery             2.  Catheter placement into left common femoral artery from left femoral approach             3.  Aortogram and selective left lower extremity angiogram             4.  Mechanical thrombectomy of the left SFA with the Rota Rex device             5.  Stent placement to the left SFA with 6 mm diameter by 7.5 cm length Viabahn stent             6.  StarClose closure device right femoral artery     The patient notes improvement in the lower extremity symptoms. No interval shortening of the patient's claudication distance or rest pain symptoms. No new ulcers or wounds have occurred since the last visit.  There have been no significant changes to the patient's overall health care.  No documented history of amaurosis fugax or recent TIA symptoms. There are no recent neurological changes noted. No documented history of DVT, PE or superficial thrombophlebitis. The patient denies recent episodes of angina or shortness of breath.   ABI's Rt=1.10 and Lt=1.10  (previous ABI's Rt=1.07 and Lt=0.97) Duplex US of the triphasic waveforms on the right with good toe waveforms with biphasic/triphasic on the left also with good toe waveforms.    Review of Systems  All other systems reviewed and are negative.      Objective:   Physical Exam Vitals reviewed.  HENT:     Head: Normocephalic.  Cardiovascular:     Rate and Rhythm: Normal rate.     Pulses:          Dorsalis pedis pulses are detected w/ Doppler on the right side and detected w/ Doppler on the left side.       Posterior tibial pulses are detected w/  Doppler on the right side and detected w/ Doppler on the left side.  Pulmonary:     Effort: Pulmonary effort is normal.  Musculoskeletal:     Left lower leg: Edema present.  Skin:    General: Skin is warm and dry.  Neurological:     Mental Status: She is alert and oriented to person, place, and time.  Psychiatric:        Mood and Affect: Mood normal.        Behavior: Behavior normal.        Thought Content: Thought content normal.        Judgment: Judgment normal.     BP (!) 140/78 (BP Location: Left Arm)   Pulse 69   Resp 17   Ht 4\' 10"  (1.473 m)   Wt 220 lb 6.4 oz (100 kg)   BMI 46.06 kg/m   Past Medical History:  Diagnosis Date   Arthritis    Fibromyalgia    Fractured hip (HCC)    GERD (gastroesophageal reflux disease)    Hypertension    Irregular heart beat  Thyroid disease    VIN III (vulvar intraepithelial neoplasia III)     Social History   Socioeconomic History   Marital status: Married    Spouse name: Not on file   Number of children: Not on file   Years of education: Not on file   Highest education level: Not on file  Occupational History   Not on file  Tobacco Use   Smoking status: Every Day    Packs/day: 0.50    Years: 30.00    Additional pack years: 0.00    Total pack years: 15.00    Types: Cigarettes    Last attempt to quit: 05/25/2013    Years since quitting: 9.0    Passive exposure: Never   Smokeless tobacco: Never  Vaping Use   Vaping Use: Never used  Substance and Sexual Activity   Alcohol use: No   Drug use: No   Sexual activity: Not on file  Other Topics Concern   Not on file  Social History Narrative   Not on file   Social Determinants of Health   Financial Resource Strain: Not on file  Food Insecurity: Not on file  Transportation Needs: Not on file  Physical Activity: Not on file  Stress: Not on file  Social Connections: Not on file  Intimate Partner Violence: Not on file    Past Surgical History:  Procedure  Laterality Date   ABDOMINAL HYSTERECTOMY  1995   [facility]   APPENDECTOMY  Jun 21, 2010   acute appendicitis and periappendicitis   CESAREAN SECTION     CHOLECYSTECTOMY     gastric stapeling  1980's   HERNIA REPAIR  06/28/13   LOWER EXTREMITY ANGIOGRAPHY Left 01/07/2022   Procedure: Lower Extremity Angiography;  Surgeon: Annice Needy, MD;  Location: [facility];  Service: Cardiovascular;  Laterality: Left;    Family History  Problem Relation Age of Onset   Alcohol abuse Mother    Dementia Mother    Cancer Mother        ovarian   Diabetes Father    Hypertension Father    Stroke Father    Alcohol abuse Brother    Alcohol abuse Sister    Cancer Sister        cervical    Allergies  Allergen Reactions   Bactrim [Sulfamethoxazole-Trimethoprim] Hives   Biaxin [Clarithromycin] Hives   Ciprofloxacin Hives   Fentanyl Hives and Other (See Comments)    Blood pressure drops significantly    Keflex [Cephalexin] Hives   Morphine And Related Hives   Sulfa Antibiotics Hives   Tramadol Hives       Latest Ref Rng & Units 06/22/2013    3:25 PM  CBC  WBC 3.6 - 11.0 x10 3/mm 3 5.7   Hemoglobin 12.0 - 16.0 g/dL 82.9   Hematocrit 56.2 - 47.0 % 41.5   Platelets 150 - 440 x10 3/mm 3 195       CMP     Component Value Date/Time   NA 139 06/22/2013 1525   K 4.2 06/22/2013 1525   CL 107 06/22/2013 1525   CO2 29 06/22/2013 1525   GLUCOSE 86 06/22/2013 1525   BUN 27 (H) 01/07/2022 1118   BUN 20 (H) 06/22/2013 1525   CREATININE 0.97 01/07/2022 1118   CREATININE 0.87 06/22/2013 1525   CALCIUM 8.9 06/22/2013 1525   GFRNONAA >60 01/07/2022 1118   GFRNONAA >60 06/22/2013 1525   GFRAA >60 06/22/2013 1525     VAS  [place] ABI WITH/WO TBI  Result Date: 02/06/2022  LOWER EXTREMITY DOPPLER STUDY Patient Name:  Tracy Durham  Date of Exam:   02/06/2022 Medical Rec #: [id number]      Accession #:    [id number] Date of Birth: Aug 13, 1950      Patient Gender: F Patient Age:   37 years  Exam Location:  [facility] Procedure:      VAS [place] ABI WITH/WO TBI Referring Phys: Barbara Cower DEW --------------------------------------------------------------------------------  Indications: Peripheral artery disease. High Risk Factors: Hypertension.  Vascular Interventions: 01/07/2022 Thrombectomy of left SFA with stent. Performing Technologist: Hardie Lora RVT  Examination Guidelines: A complete evaluation includes at minimum, Doppler waveform signals and systolic blood pressure reading at the level of bilateral brachial, anterior tibial, and posterior tibial arteries, when vessel segments are accessible. Bilateral testing is considered an integral part of a complete examination. Photoelectric Plethysmograph (PPG) waveforms and toe systolic pressure readings are included as required and additional duplex testing as needed. Limited examinations for reoccurring indications may be performed as noted.  ABI Findings: +---------+------------------+-----+---------+--------+ Right    Rt Pressure (mmHg)IndexWaveform Comment  +---------+------------------+-----+---------+--------+ Brachial 177                                      +---------+------------------+-----+---------+--------+ PTA      189               1.07 triphasic         +---------+------------------+-----+---------+--------+ DP       176               0.99 biphasic          +---------+------------------+-----+---------+--------+ Great Toe115               0.65                   +---------+------------------+-----+---------+--------+ +---------+------------------+-----+---------+-------+ Left     Lt Pressure (mmHg)IndexWaveform Comment +---------+------------------+-----+---------+-------+ Brachial 173                                     +---------+------------------+-----+---------+-------+ PTA      171               0.97 triphasic        +---------+------------------+-----+---------+-------+ DP        164               0.93 triphasic        +---------+------------------+-----+---------+-------+ Great Toe95                0.54                  +---------+------------------+-----+---------+-------+ +-------+-----------+-----------+------------+------------+ ABI/TBIToday's ABIToday's TBIPrevious ABIPrevious TBI +-------+-----------+-----------+------------+------------+ Right  1.07       0.65       1.10        0.94         +-------+-----------+-----------+------------+------------+ Left   0.97       0.54       0.67        0.00         +-------+-----------+-----------+------------+------------+  Right ABIs appear essentially unchanged compared to prior study on 01/04/2022. Left ABIs appear increased compared to prior study on 01/04/2022.  Summary: Right: Resting right ankle-brachial index is within normal range. The right toe-brachial index  is abnormal. Left: Resting left ankle-brachial index is within normal range. The left toe-brachial index is abnormal. *See table(s) above for measurements and observations.  Electronically signed by Festus Barren MD on 02/06/2022 at 3:27:35 PM.    Final    VAS [place] ABI WITH/WO TBI  Result Date: 01/04/2022  LOWER EXTREMITY DOPPLER STUDY Patient Name:  SHEREDA BUTLAND  Date of Exam:   01/04/2022 Medical Rec #: [id number]      Accession #:    [id number] Date of Birth: 04-27-1950      Patient Gender: F Patient Age:   30 years Exam Location:  [facility] Procedure:      VAS [place] ABI WITH/WO TBI Referring Phys: Festus Barren --------------------------------------------------------------------------------  Indications: Discoloration on toes  Performing Technologist: Debbe Bales RVS  Examination Guidelines: A complete evaluation includes at minimum, Doppler waveform signals and systolic blood pressure reading at the level of bilateral brachial, anterior tibial, and posterior tibial arteries, when vessel segments are accessible. Bilateral testing is  considered an integral part of a complete examination. Photoelectric Plethysmograph (PPG) waveforms and toe systolic pressure readings are included as required and additional duplex testing as needed. Limited examinations for reoccurring indications may be performed as noted.  ABI Findings: +---------+------------------+-----+---------+--------+ Right    Rt Pressure (mmHg)IndexWaveform Comment  +---------+------------------+-----+---------+--------+ Brachial 175                                      +---------+------------------+-----+---------+--------+ ATA      130               0.74 biphasic          +---------+------------------+-----+---------+--------+ PTA      192               1.10 triphasic         +---------+------------------+-----+---------+--------+ Great Toe165               0.94 Normal            +---------+------------------+-----+---------+--------+ +---------+------------------+-----+--------+-------+ Left     Lt Pressure (mmHg)IndexWaveformComment +---------+------------------+-----+--------+-------+ Brachial 172                                    +---------+------------------+-----+--------+-------+ ATA      77                0.44                 +---------+------------------+-----+--------+-------+ PTA      117               0.67                 +---------+------------------+-----+--------+-------+ Great Toe0                 0.00                 +---------+------------------+-----+--------+-------+ +-------+-----------+-----------+------------+------------+ ABI/TBIToday's ABIToday's TBIPrevious ABIPrevious TBI +-------+-----------+-----------+------------+------------+ Right  1.10       .94                                 +-------+-----------+-----------+------------+------------+ Left   .67        0                                   +-------+-----------+-----------+------------+------------+  TOES Findings:  +----------+---------------+--------+-------+ Right ToesPressure (mmHg)WaveformComment +----------+---------------+--------+-------+ 1st Digit 45                             +----------+---------------+--------+-------+ 2nd Digit 49                             +----------+---------------+--------+-------+ 3rd Digit 49                             +----------+---------------+--------+-------+ 4th Digit 47                             +----------+---------------+--------+-------+ 5th Digit 31                             +----------+---------------+--------+-------+  +---------+---------------+--------+-------+ Left ToesPressure (mmHg)WaveformComment +---------+---------------+--------+-------+ 1st Digit24                             +---------+---------------+--------+-------+ 2nd Digit30                             +---------+---------------+--------+-------+ 3rd Digit4                              +---------+---------------+--------+-------+ 4th Digit20                             +---------+---------------+--------+-------+ 5th Digit16                             +---------+---------------+--------+-------+    Summary: Right: Resting right ankle-brachial index is within normal range. The right toe-brachial index is normal. Left: Resting left ankle-brachial index indicates moderate left lower extremity arterial disease. The left toe-brachial index is abnormal. *See table(s) above for measurements and observations.  Electronically signed by Festus Barren MD on 01/04/2022 at 12:09:54 PM.    Final        Assessment & Plan:   1. Peripheral arterial disease with history of revascularization (HCC) Recommend:  The patient is status post successful angiogram with intervention.  The patient reports that the claudication symptoms and leg pain has improved.   The patient denies lifestyle limiting changes at this point in time.  No further invasive studies,  angiography or surgery at this time The patient should continue walking and begin a more formal exercise program.  The patient should continue antiplatelet therapy and aggressive treatment of the lipid abnormalities  Continued surveillance is indicated as atherosclerosis is likely to progress with time.    Patient should undergo noninvasive studies as ordered. The patient will follow up with me to review the studies.  - VAS [place] ABI WITH/WO TBI  2. Essential hypertension Continue antihypertensive medications as already ordered, these medications have been reviewed and there are no changes at this time.   3. Tobacco use disorder Smoking cessation was discussed, 3-10 minutes spent on this topic specifically  Current Outpatient Medications on File Prior to Visit  Medication Sig Dispense Refill   amLODipine (NORVASC) 5 MG tablet Take 1 tablet by mouth daily.     aspirin EC  81 MG tablet Take 1 tablet (81 mg total) by mouth daily. Swallow whole. 150 tablet 2   atorvastatin (LIPITOR) 10 MG tablet Take 1 tablet (10 mg total) by mouth daily. 30 tablet 11   carvedilol (COREG) 6.25 MG tablet Take 6.25 mg by mouth 2 (two) times daily with a meal.     citalopram (CELEXA) 40 MG tablet Take 40 mg by mouth daily.     clopidogrel (PLAVIX) 75 MG tablet Take 1 tablet (75 mg total) by mouth daily. 30 tablet 6   DULoxetine (CYMBALTA) 60 MG capsule Take 60 mg by mouth daily.     famotidine (PEPCID) 20 MG tablet Take 1 tablet by mouth 2 (two) times daily.     levothyroxine (SYNTHROID, LEVOTHROID) 50 MCG tablet Take 1 tablet by mouth daily.     No current facility-administered medications on file prior to visit.    There are no Patient Instructions on file for this visit. No follow-ups on file.   Georgiana Spinner, NP

## 2022-06-12 LAB — VAS US ABI WITH/WO TBI
Left ABI: 1.1
Right ABI: 1.1

## 2022-08-15 ENCOUNTER — Other Ambulatory Visit (INDEPENDENT_AMBULATORY_CARE_PROVIDER_SITE_OTHER): Payer: Self-pay | Admitting: Vascular Surgery

## 2022-12-04 ENCOUNTER — Other Ambulatory Visit (INDEPENDENT_AMBULATORY_CARE_PROVIDER_SITE_OTHER): Payer: Self-pay

## 2022-12-04 MED ORDER — ASPIRIN 81 MG PO TBEC: 81.0000 mg | DELAYED_RELEASE_TABLET | Freq: Every day | ORAL | 3 refills | Status: DC

## 2022-12-04 NOTE — Telephone Encounter (Signed)
[facility] faxed medication refill form for 81 mg Aspirin EC, 1 tablet once a day   Sent Rx electronically

## 2022-12-10 ENCOUNTER — Other Ambulatory Visit (INDEPENDENT_AMBULATORY_CARE_PROVIDER_SITE_OTHER): Payer: Self-pay | Admitting: Nurse Practitioner

## 2022-12-10 DIAGNOSIS — I739 Peripheral vascular disease, unspecified: Secondary | ICD-10-CM

## 2022-12-13 ENCOUNTER — Encounter (INDEPENDENT_AMBULATORY_CARE_PROVIDER_SITE_OTHER): Payer: Medicare Other

## 2022-12-13 ENCOUNTER — Ambulatory Visit (INDEPENDENT_AMBULATORY_CARE_PROVIDER_SITE_OTHER): Payer: Medicare Other | Admitting: Vascular Surgery

## 2023-01-27 ENCOUNTER — Other Ambulatory Visit (INDEPENDENT_AMBULATORY_CARE_PROVIDER_SITE_OTHER): Payer: Self-pay

## 2023-01-27 MED ORDER — CLOPIDOGREL BISULFATE 75 MG PO TABS: 75.0000 mg | ORAL_TABLET | Freq: Every day | ORAL | 3 refills | Status: DC

## 2023-01-31 ENCOUNTER — Other Ambulatory Visit (INDEPENDENT_AMBULATORY_CARE_PROVIDER_SITE_OTHER): Payer: Self-pay | Admitting: Vascular Surgery

## 2023-04-01 ENCOUNTER — Other Ambulatory Visit (INDEPENDENT_AMBULATORY_CARE_PROVIDER_SITE_OTHER): Payer: Self-pay | Admitting: Vascular Surgery

## 2023-04-01 NOTE — Telephone Encounter (Signed)
 Patient will need to contact the office to schedule appointment  to continue with future refills

## 2023-06-17 ENCOUNTER — Encounter (INDEPENDENT_AMBULATORY_CARE_PROVIDER_SITE_OTHER): Payer: Self-pay

## 2023-06-19 ENCOUNTER — Emergency Department

## 2023-06-19 ENCOUNTER — Other Ambulatory Visit: Payer: Self-pay

## 2023-06-19 ENCOUNTER — Emergency Department
Admission: EM | Admit: 2023-06-19 | Discharge: 2023-06-19 | Disposition: A | Discharge status: Home / Self Care | Attending: Emergency Medicine | Admitting: Emergency Medicine

## 2023-06-19 DIAGNOSIS — Y92481 Parking lot as the place of occurrence of the external cause: Secondary | ICD-10-CM | POA: Diagnosis not present

## 2023-06-19 DIAGNOSIS — Z7901 Long term (current) use of anticoagulants: Secondary | ICD-10-CM | POA: Diagnosis not present

## 2023-06-19 DIAGNOSIS — S42202A Unspecified fracture of upper end of left humerus, initial encounter for closed fracture: Secondary | ICD-10-CM | POA: Diagnosis not present

## 2023-06-19 DIAGNOSIS — W19XXXA Unspecified fall, initial encounter: Secondary | ICD-10-CM

## 2023-06-19 DIAGNOSIS — I10 Essential (primary) hypertension: Secondary | ICD-10-CM | POA: Diagnosis not present

## 2023-06-19 DIAGNOSIS — S4992XA Unspecified injury of left shoulder and upper arm, initial encounter: Secondary | ICD-10-CM | POA: Diagnosis present

## 2023-06-19 DIAGNOSIS — Y9301 Activity, walking, marching and hiking: Secondary | ICD-10-CM | POA: Diagnosis not present

## 2023-06-19 DIAGNOSIS — X501XXA Overexertion from prolonged static or awkward postures, initial encounter: Secondary | ICD-10-CM | POA: Insufficient documentation

## 2023-06-19 MED ORDER — OXYCODONE-ACETAMINOPHEN 7.5-325 MG PO TABS: 1.0000 | ORAL_TABLET | Freq: Every evening | ORAL | 0 refills | Status: DC | PRN

## 2023-06-19 MED ORDER — MORPHINE SULFATE (PF) 4 MG/ML IV SOLN
4.0000 mg | Freq: Once | INTRAVENOUS | Status: AC
  Administered 2023-06-19: 4 mg via INTRAVENOUS
  Filled 2023-06-19: qty 1

## 2023-06-19 MED ORDER — OXYCODONE-ACETAMINOPHEN 5-325 MG PO TABS
1.0000 | ORAL_TABLET | Freq: Once | ORAL | Status: AC
  Administered 2023-06-19: 1 via ORAL
  Filled 2023-06-19: qty 1

## 2023-06-19 MED ORDER — LIDOCAINE 5 % EX PTCH: 1.0000 | MEDICATED_PATCH | CUTANEOUS | 0 refills | Status: AC

## 2023-06-19 MED ORDER — ONDANSETRON 4 MG PO TBDP
4.0000 mg | ORAL_TABLET | Freq: Once | ORAL | Status: AC
  Administered 2023-06-19: 4 mg via ORAL
  Filled 2023-06-19: qty 1

## 2023-06-19 MED ORDER — ONDANSETRON HCL 4 MG/2ML IJ SOLN
4.0000 mg | Freq: Once | INTRAMUSCULAR | Status: AC
  Administered 2023-06-19: 4 mg via INTRAVENOUS
  Filled 2023-06-19: qty 2

## 2023-06-19 MED ORDER — LIDOCAINE 5 % EX PTCH: 1.0000 | MEDICATED_PATCH | CUTANEOUS | 0 refills | Status: DC

## 2023-06-19 MED ORDER — ACETAMINOPHEN ER 650 MG PO TBCR: 1300.0000 mg | EXTENDED_RELEASE_TABLET | Freq: Three times a day (TID) | ORAL | 0 refills | Status: DC | PRN

## 2023-06-19 MED ORDER — ACETAMINOPHEN ER 650 MG PO TBCR: 1300.0000 mg | EXTENDED_RELEASE_TABLET | Freq: Three times a day (TID) | ORAL | 0 refills | Status: AC | PRN

## 2023-06-19 MED ORDER — LIDOCAINE 5 % EX PTCH
1.0000 | MEDICATED_PATCH | CUTANEOUS | Status: DC
Start: 2023-06-19 — End: 2023-06-19
  Administered 2023-06-19: 1 via TRANSDERMAL
  Filled 2023-06-19: qty 1

## 2023-06-19 NOTE — ED Provider Notes (Signed)
 [facility] Provider Note    Event Date/Time   First MD Initiated Contact with Patient 06/19/23 1411     (approximate)   History   Fall and Shoulder Injury   HPI  Tracy Durham is a 73 y.o. female history of hypertension, cardiac stent presents to the emergency department after a fall as she walked through the door [employer].  Patient states she fell onto the left side and heard her left shoulder.  Hurts up in the joint.  Also takes Plavix .  No head injury.  No vomiting.      Physical Exam   Triage Vital Signs: ED Triage Vitals  Encounter Vitals Group     BP 06/19/23 1302 137/64     Systolic BP Percentile --      Diastolic BP Percentile --      Pulse Rate 06/19/23 1302 (!) 58     Resp 06/19/23 1302 16     Temp 06/19/23 1302 97.7 F (36.5 C)     Temp Source 06/19/23 1302 Oral     SpO2 06/19/23 1302 97 %     Weight 06/19/23 1303 210 lb (95.3 kg)     Height 06/19/23 1303 4\' 10"  (1.473 m)     Head Circumference --      Peak Flow --      Pain Score 06/19/23 1303 10     Pain Loc --      Pain Education --      Exclude from Growth Chart --     Most recent vital signs: Vitals:   06/19/23 1302  BP: 137/64  Pulse: (!) 58  Resp: 16  Temp: 97.7 F (36.5 C)  SpO2: 97%     General: Awake, no distress.   CV:  Good peripheral perfusion. Resp:  Normal effort.  Abd:  No distention.   Other:  Left shoulder extremely tender at the joint space, elbow nontender, forearm nontender, C-spine nontender   ED Results / Procedures / Treatments   Labs (all labs ordered are listed, but only abnormal results are displayed) Labs Reviewed - No data to display   EKG     RADIOLOGY X-ray left shoulder, CT of the head due to fall with Plavix     PROCEDURES:   Procedures  Critical Care:  no Chief Complaint  Patient presents with   Fall   Shoulder Injury      MEDICATIONS ORDERED IN ED: Medications  oxyCODONE -acetaminophen   (PERCOCET/ROXICET) 5-325 MG per tablet 1 tablet (1 tablet Oral Given 06/19/23 1430)  ondansetron  (ZOFRAN -ODT) disintegrating tablet 4 mg (4 mg Oral Given 06/19/23 1430)     IMPRESSION / MDM / ASSESSMENT AND PLAN / ED COURSE  I reviewed the triage vital signs and the nursing notes.                              Differential diagnosis includes, but is not limited to, subdural, SAH, CVA, fracture, contusion, sprain  Patient's presentation is most consistent with acute illness / injury with system symptoms.   Cardiac monitor no Medications given: Oxycodone  for pain, Zofran  ODT for nausea  X-ray of the left shoulder was independently reviewed interpreted by me as having a fracture at the humeral neck/head, radiology read is still pending  Patient was given Percocet for pain, Zofran  for nausea.  Due to her being on Plavix  went ahead and ordered CT of the head to make sure  there is no subdural etc. due to the fall  Care is being transferred to Awilda Lennox, PA-C at shift change.  Plan is to await CT results.  Contact Ortho for fractured shoulder pending radiology read.      FINAL CLINICAL IMPRESSION(S) / ED DIAGNOSES   Final diagnoses:  Closed fracture of proximal end of left humerus, unspecified fracture morphology, initial encounter  Fall, initial encounter     Rx / DC Orders   ED Discharge Orders     None        Note:  This document was prepared using Dragon voice recognition software and may include unintentional dictation errors.    Delsie Figures, PA-C 06/19/23 1524    Iver Marker, MD 06/29/23 [phone]

## 2023-06-19 NOTE — ED Triage Notes (Signed)
 First Nurse Note;  Pt via [facility] from [employer], mechanical fall in the parking lot. Report L shoulder pain and she heard a "pop." Pt does takes Plavix , denies head injury. Pt is A&OX4 and NAD  68 HR  140/98 BP  95% on RA

## 2023-06-19 NOTE — ED Provider Notes (Signed)
 ----------------------------------------- 4:01 PM on 06/19/2023 -----------------------------------------  Blood pressure 137/64, pulse (!) 58, temperature 97.7 F (36.5 C), temperature source Oral, resp. rate 16, height 4\' 10"  (1.473 m), weight 95.3 kg, SpO2 97%.  Assuming care from Aneta Keepers, PA-C/NP-C.  In short, Tracy Durham is a 73 y.o. female with a chief complaint of Fall and Shoulder Injury .  Refer to the original H&P for additional details.  The current plan of care is to get official report of radiology for treatment and disposition.  ____________________________________________    ED Results / Procedures / Treatments   Labs (all labs ordered are listed, but only abnormal results are displayed) Labs Reviewed - No data to display   EKG    RADIOLOGY I personally viewed and evaluated these images as part of my medical decision making, as well as reviewing the written report by the radiologist.  ED Provider Interpretation:   DG Shoulder Left Result Date: 06/19/2023 CLINICAL DATA:  Pain after fall. EXAM: LEFT SHOULDER - 2+ VIEW COMPARISON:  None Available. FINDINGS: Displaced proximal humerus fracture. There is involvement of the surgical neck. Slight anterior displacement of distal fracture fragments. No glenohumeral dislocation. Acromioclavicular joint is congruent. Remote healed fourth left rib fracture. IMPRESSION: Displaced proximal humerus fracture. Electronically Signed   By: Chadwick Colonel M.D.   On: 06/19/2023 15:23     PROCEDURES:  Critical Care performed: No  Procedures   MEDICATIONS ORDERED IN ED: Medications  morphine (PF) 4 MG/ML injection 4 mg (has no administration in time range)  ondansetron  (ZOFRAN ) injection 4 mg (has no administration in time range)  oxyCODONE-acetaminophen  (PERCOCET/ROXICET) 5-325 MG per tablet 1 tablet (1 tablet Oral Given 06/19/23 1430)  ondansetron  (ZOFRAN -ODT) disintegrating tablet 4 mg (4 mg Oral Given 06/19/23 1430)      IMPRESSION / MDM / ASSESSMENT AND PLAN / ED COURSE  I reviewed the triage vital signs and the nursing notes.                              Differential diagnosis includes, but is not limited to, fracture, dislocation, muscle strain, foreign body  Patient's presentation is most consistent with acute complicated illness / injury requiring diagnostic workup.  Patient's diagnosis is consistent with closed fracture of proximal end of the left humerus. Patient will be discharged home with prescriptions for Percocet, acetaminophen , lidocaine patch.  I explained the patient to take acetaminophen  during the day and Percocet before bedtime in case of drowsiness to prevent falls. Patient is to follow up with orthopedics as needed or otherwise directed. Patient is given ED precautions to return to the ED for any worsening or new symptoms.  Clinical Course as of 06/19/23 1649  Thu Jun 19, 2023  1559 DG Shoulder Left Displaced proximal humerus fracture. [AE]  1612 Consulted Dr. Lydia Sams, orthopedics on-call who recommended wearing sling and referred the patient for a follow-up with him [AE]  1636 Reassessed the patient, patient is complaining about pain.  I am ordering morphine and Zofran  IV.  In the system appears patient is allergic to morphine but patient explained it that her reaction to morphine is nauseas and vomit.  Patient will receive morphine IV with Zofran  IV.  Updated patient about results of x-ray and Dr. Basilio Both recommendations.  Patient is going to be discharged with acetaminophen  650.  Patient is taking blood thinners [AE]  1638 Patient declined need CT scan of the head, she states that she did  not hit her head.  CT scan is behind today due to emergencies.  [AE]    Clinical Course User Index [AE] Awilda Lennox, PA-C    FINAL CLINICAL IMPRESSION(S) / ED DIAGNOSES   Final diagnoses:  Closed fracture of proximal end of left humerus, unspecified fracture morphology, initial encounter   Fall, initial encounter     Rx / DC Orders   ED Discharge Orders          Ordered    lidocaine (LIDODERM) 5 %  Every 24 hours        06/19/23 1642    acetaminophen  (ACETAMINOPHEN  8 HOUR) 650 MG CR tablet  Every 8 hours PRN        06/19/23 1642    oxyCODONE-acetaminophen  (PERCOCET) 7.5-325 MG tablet  At bedtime PRN        06/19/23 1646             Note:  This document was prepared using Dragon voice recognition software and may include unintentional dictation errors.    Awilda Lennox, PA-C 06/19/23 1651    Twilla Galea, MD 06/19/23 2231

## 2023-06-19 NOTE — Discharge Instructions (Addendum)
 You have been diagnosed with close fracture of the right humerus.  Please take acetaminophen  2 tablets by mouth every 8 hours as needed for pain.  You can take Percocet 1 tablet before going to bed.  Do not take acetaminophen  plus Percocet at the same time.  You can apply 1 lidocaine patch onto the skin every 12 hours.  Please call Dr. Lydia Sams and make an appointment for a follow-up.  Please come back to ED or go to your PCP if you have new symptoms or symptoms worsen.

## 2023-07-03 ENCOUNTER — Other Ambulatory Visit (INDEPENDENT_AMBULATORY_CARE_PROVIDER_SITE_OTHER): Payer: Self-pay | Admitting: Vascular Surgery

## 2023-07-04 NOTE — Telephone Encounter (Signed)
 Patient will need to rescheduled cancelled appointment to receive further refills. Message was left on patient voicemail

## 2023-10-16 ENCOUNTER — Other Ambulatory Visit (INDEPENDENT_AMBULATORY_CARE_PROVIDER_SITE_OTHER): Payer: Self-pay | Admitting: Vascular Surgery

## 2023-10-16 NOTE — Telephone Encounter (Signed)
 Patient will need to contact the office to rescheduled missed appt to receive further refills

## 2023-11-20 ENCOUNTER — Encounter: Payer: Self-pay | Admitting: Ophthalmology

## 2023-11-20 NOTE — Anesthesia Preprocedure Evaluation (Addendum)
 Anesthesia Evaluation  Patient identified by MRN, date of birth, ID band Patient awake    Reviewed: Allergy & Precautions, H&P , NPO status , Patient's Chart, lab work & pertinent test results  History of Anesthesia Complications (+) history of anesthetic complications  Airway Mallampati: III  TM Distance: >3 FB Neck ROM: Full    Dental no notable dental hx. (+) Poor Dentition, Loose Plans to soon have all upper teeth pulled and have dentures upper:   Pulmonary shortness of breath, pneumonia, Current Smoker  Slightly coarse breath sounds, smoker  breath sounds clear to auscultation       Cardiovascular hypertension, + Peripheral Vascular Disease  Normal cardiovascular exam+ dysrhythmias  Rhythm:Regular Rate:Normal     Neuro/Psych  PSYCHIATRIC DISORDERS  Depression     Neuromuscular disease negative neurological ROS  negative psych ROS   GI/Hepatic negative GI ROS, Neg liver ROS,GERD  ,,  Endo/Other  negative endocrine ROSHypothyroidism    Renal/GU Renal diseasenegative Renal ROS  negative genitourinary   Musculoskeletal negative musculoskeletal ROS (+) Arthritis ,  Fibromyalgia -  Abdominal   Peds negative pediatric ROS (+)  Hematology negative hematology ROS (+)   Anesthesia Other Findings Arthritis  Fibromyalgia Thyroid disease  Hypertension GERD (gastroesophageal reflux disease) Irregular heart beat Fractured hip   VIN III (vulvar intraepithelial neoplasia III) PVD (peripheral vascular disease) Acquired hypothyroidism Status post peripheral artery angioplasty with insertion of stent  Major depressive disorder, recurrent, moderate Tobacco use disorder  Stage 3a chronic kidney disease Atherosclerosis of artery of extremity with ulceration   Acquired hypothyroidism Age-related osteoporosis without current pathological fracture  Severe obesity     Reproductive/Obstetrics negative OB ROS                               Anesthesia Physical Anesthesia Plan  ASA: 3  Anesthesia Plan: MAC   Post-op Pain Management:    Induction: Intravenous  PONV Risk Score and Plan:   Airway Management Planned: Natural Airway and Nasal Cannula  Additional Equipment:   Intra-op Plan:   Post-operative Plan:   Informed Consent: I have reviewed the patients History and Physical, chart, labs and discussed the procedure including the risks, benefits and alternatives for the proposed anesthesia with the patient or authorized representative who has indicated his/her understanding and acceptance.     Dental Advisory Given  Plan Discussed with: Anesthesiologist, CRNA and Surgeon  Anesthesia Plan Comments: (Patient consented for risks of anesthesia including but not limited to:  - adverse reactions to medications - damage to eyes, teeth, lips or other oral mucosa - nerve damage due to positioning  - sore throat or hoarseness - Damage to heart, brain, nerves, lungs, other parts of body or loss of life  Patient voiced understanding and assent.)         Anesthesia Quick Evaluation

## 2023-11-24 ENCOUNTER — Encounter: Payer: Self-pay | Admitting: Ophthalmology

## 2023-11-25 NOTE — Discharge Instructions (Signed)

## 2023-11-27 ENCOUNTER — Encounter
Admission: RE | Disposition: A | Discharge status: Home / Self Care | Payer: Self-pay | Source: Home / Self Care | Attending: Ophthalmology

## 2023-11-27 ENCOUNTER — Ambulatory Visit: Payer: Self-pay | Admitting: Anesthesiology

## 2023-11-27 ENCOUNTER — Encounter: Payer: Self-pay | Admitting: Ophthalmology

## 2023-11-27 ENCOUNTER — Ambulatory Visit
Admission: RE | Admit: 2023-11-27 | Discharge: 2023-11-27 | Disposition: A | Discharge status: Home / Self Care | Attending: Ophthalmology | Admitting: Ophthalmology

## 2023-11-27 ENCOUNTER — Other Ambulatory Visit: Payer: Self-pay

## 2023-11-27 DIAGNOSIS — M810 Age-related osteoporosis without current pathological fracture: Secondary | ICD-10-CM | POA: Insufficient documentation

## 2023-11-27 DIAGNOSIS — M1990 Unspecified osteoarthritis, unspecified site: Secondary | ICD-10-CM | POA: Insufficient documentation

## 2023-11-27 DIAGNOSIS — I739 Peripheral vascular disease, unspecified: Secondary | ICD-10-CM | POA: Diagnosis not present

## 2023-11-27 DIAGNOSIS — M797 Fibromyalgia: Secondary | ICD-10-CM | POA: Diagnosis not present

## 2023-11-27 DIAGNOSIS — E039 Hypothyroidism, unspecified: Secondary | ICD-10-CM | POA: Insufficient documentation

## 2023-11-27 DIAGNOSIS — F32A Depression, unspecified: Secondary | ICD-10-CM | POA: Insufficient documentation

## 2023-11-27 DIAGNOSIS — N1831 Chronic kidney disease, stage 3a: Secondary | ICD-10-CM | POA: Diagnosis not present

## 2023-11-27 DIAGNOSIS — I129 Hypertensive chronic kidney disease with stage 1 through stage 4 chronic kidney disease, or unspecified chronic kidney disease: Secondary | ICD-10-CM | POA: Diagnosis not present

## 2023-11-27 DIAGNOSIS — F17210 Nicotine dependence, cigarettes, uncomplicated: Secondary | ICD-10-CM | POA: Insufficient documentation

## 2023-11-27 DIAGNOSIS — H2511 Age-related nuclear cataract, right eye: Secondary | ICD-10-CM | POA: Diagnosis present

## 2023-11-27 HISTORY — DX: Other complications of anesthesia, initial encounter: T88.59XA

## 2023-11-27 HISTORY — DX: Dyspnea, unspecified: R06.00

## 2023-11-27 HISTORY — DX: Morbid (severe) obesity due to excess calories: E66.01

## 2023-11-27 HISTORY — DX: Other atherosclerosis of native arteries of extremities, unspecified extremity: I70.299

## 2023-11-27 HISTORY — DX: Peripheral vascular angioplasty status with implants and grafts: Z95.820

## 2023-11-27 HISTORY — DX: Chronic kidney disease, stage 3a: N18.31

## 2023-11-27 HISTORY — DX: Major depressive disorder, recurrent, moderate: F33.1

## 2023-11-27 HISTORY — DX: Peripheral vascular disease, unspecified: I73.9

## 2023-11-27 HISTORY — DX: Pneumonia, unspecified organism: J18.9

## 2023-11-27 HISTORY — DX: Nicotine dependence, unspecified, uncomplicated: F17.200

## 2023-11-27 HISTORY — PX: CATARACT EXTRACTION W/PHACO: SHX586

## 2023-11-27 HISTORY — DX: Age-related osteoporosis without current pathological fracture: M81.0

## 2023-11-27 HISTORY — DX: Cardiac arrhythmia, unspecified: I49.9

## 2023-11-27 HISTORY — DX: Hypothyroidism, unspecified: E03.9

## 2023-11-27 SURGERY — PHACOEMULSIFICATION, CATARACT, WITH IOL INSERTION
Anesthesia: Monitor Anesthesia Care | Laterality: Right

## 2023-11-27 MED ORDER — PHENYLEPHRINE HCL 10 % OP SOLN
1.0000 [drp] | OPHTHALMIC | Status: AC
  Administered 2023-11-27 (×3): 1 [drp] via OPHTHALMIC

## 2023-11-27 MED ORDER — MIDAZOLAM HCL (PF) 2 MG/2ML IJ SOLN
INTRAMUSCULAR | Status: DC | PRN
  Administered 2023-11-27 (×2): 1 mg via INTRAVENOUS

## 2023-11-27 MED ORDER — MIDAZOLAM HCL 2 MG/2ML IJ SOLN
INTRAMUSCULAR | Status: AC
  Filled 2023-11-27: qty 2

## 2023-11-27 MED ORDER — CYCLOPENTOLATE HCL 2 % OP SOLN
OPHTHALMIC | Status: AC
  Filled 2023-11-27: qty 2

## 2023-11-27 MED ORDER — TETRACAINE HCL 0.5 % OP SOLN
OPHTHALMIC | Status: AC
  Filled 2023-11-27: qty 4

## 2023-11-27 MED ORDER — CYCLOPENTOLATE HCL 2 % OP SOLN
1.0000 [drp] | OPHTHALMIC | Status: AC
  Administered 2023-11-27 (×3): 1 [drp] via OPHTHALMIC

## 2023-11-27 MED ORDER — SIGHTPATH DOSE#1 NA HYALUR & NA CHOND-NA HYALUR IO KIT
PACK | INTRAOCULAR | Status: DC | PRN
  Administered 2023-11-27: 1 via OPHTHALMIC

## 2023-11-27 MED ORDER — PHENYLEPHRINE HCL 10 % OP SOLN
OPHTHALMIC | Status: AC
  Filled 2023-11-27: qty 5

## 2023-11-27 MED ORDER — SIGHTPATH DOSE#1 BSS IO SOLN
INTRAOCULAR | Status: DC | PRN
  Administered 2023-11-27: 15 mL via INTRAOCULAR

## 2023-11-27 MED ORDER — MOXIFLOXACIN HCL 0.5 % OP SOLN
OPHTHALMIC | Status: DC | PRN
  Administered 2023-11-27: .2 mL via OPHTHALMIC

## 2023-11-27 MED ORDER — SIGHTPATH DOSE#1 BSS IO SOLN
INTRAOCULAR | Status: DC | PRN
  Administered 2023-11-27: 114 mL via OPHTHALMIC

## 2023-11-27 MED ORDER — LIDOCAINE HCL (PF) 2 % IJ SOLN
INTRAOCULAR | Status: DC | PRN
  Administered 2023-11-27: 1 mL via INTRAOCULAR

## 2023-11-27 MED ORDER — LACTATED RINGERS IV SOLN: INTRAVENOUS | Status: DC

## 2023-11-27 MED ORDER — BRIMONIDINE TARTRATE-TIMOLOL 0.2-0.5 % OP SOLN
OPHTHALMIC | Status: DC | PRN
  Administered 2023-11-27: 1 [drp] via OPHTHALMIC

## 2023-11-27 MED ORDER — FENTANYL CITRATE (PF) 100 MCG/2ML IJ SOLN
INTRAMUSCULAR | Status: AC
  Filled 2023-11-27: qty 2

## 2023-11-27 MED ORDER — TETRACAINE HCL 0.5 % OP SOLN
1.0000 [drp] | OPHTHALMIC | Status: DC | PRN
  Administered 2023-11-27 (×3): 1 [drp] via OPHTHALMIC

## 2023-11-27 SURGICAL SUPPLY — 10 items
DISSECTOR HYDRO NUCLEUS 50X22 (MISCELLANEOUS) ×1 IMPLANT
DRSG TEGADERM 2-3/8X2-3/4 SM (GAUZE/BANDAGES/DRESSINGS) ×1 IMPLANT
FEE CATARACT SUITE SIGHTPATH (MISCELLANEOUS) ×1 IMPLANT
GLOVE BIOGEL PI IND STRL 8 (GLOVE) ×1 IMPLANT
GLOVE SURG LX STRL 7.5 STRW (GLOVE) ×1 IMPLANT
GLOVE SURG SYN 6.5 PF PI BL (GLOVE) ×1 IMPLANT
LENS IOL TECNIS MONO 19.0 (Intraocular Lens) IMPLANT
NDL FILTER BLUNT 18X1 1/2 (NEEDLE) ×1 IMPLANT
NEEDLE FILTER BLUNT 18X1 1/2 (NEEDLE) ×1 IMPLANT
SYR 3ML LL SCALE MARK (SYRINGE) ×1 IMPLANT

## 2023-11-27 NOTE — Anesthesia Postprocedure Evaluation (Signed)
 Anesthesia Post Note  Patient: Tracy Durham  Procedure(s) Performed: PHACOEMULSIFICATION, CATARACT, WITH IOL INSERTION 21.0, 01:53.8 (Right)  Patient location during evaluation: PACU Anesthesia Type: MAC Level of consciousness: awake and alert Pain management: pain level controlled Vital Signs Assessment: post-procedure vital signs reviewed and stable Respiratory status: spontaneous breathing, nonlabored ventilation, respiratory function stable and patient connected to nasal cannula oxygen Cardiovascular status: stable and blood pressure returned to baseline Postop Assessment: no apparent nausea or vomiting Anesthetic complications: no   No notable events documented.   Last Vitals:  Vitals:   11/27/23 0907 11/27/23 0912  BP:    Pulse: 62 61  Resp: 16 19  Temp: 36.6 C 36.6 C  SpO2: 98% 96%    Last Pain:  Vitals:   11/27/23 0912  PainSc: 0-No pain                 Karron Alvizo C Tabbatha Bordelon

## 2023-11-27 NOTE — Op Note (Signed)
 OPERATIVE NOTE  Tracy Durham [id number] 11/27/2023   PREOPERATIVE DIAGNOSIS: Nuclear sclerotic cataract right eye. H25.11   POSTOPERATIVE DIAGNOSIS: Nuclear sclerotic cataract right eye. H25.11   PROCEDURE:  Phacoemulsification with posterior chamber intraocular lens placement of the right eye  Ultrasound time: Procedure(s): PHACOEMULSIFICATION, CATARACT, WITH IOL INSERTION 21.0, 01:53.8 (Right)  LENS:   Implant Name Type Inv. Item Serial No. Manufacturer Lot No. LRB No. Used Action  LENS IOL TECNIS MONO 19.0 - [id number] Intraocular Lens LENS IOL TECNIS MONO 19.0 [id number] [employer]  Right 1 Implanted      SURGEON:  Feliciano HERO. Enola, MD   ANESTHESIA:  Topical with tetracaine drops, augmented with 1% preservative-free intracameral lidocaine .   COMPLICATIONS:  None.   DESCRIPTION OF PROCEDURE:  The patient was identified in the holding room and transported to the operating room and placed in the supine position under the operating microscope.  The right eye was identified as the operative eye, which was prepped and draped in the usual sterile ophthalmic fashion.   A 1 millimeter clear-corneal paracentesis was made superotemporally. Preservative-free 1% lidocaine  mixed with 1:1,000 bisulfite-free aqueous solution of epinephrine was injected into the anterior chamber. The anterior chamber was then filled with Viscoat viscoelastic. A 2.4 millimeter keratome was used to make a clear-corneal incision inferotemporally. A curvilinear capsulorrhexis was made with a cystotome and capsulorrhexis forceps. Balanced salt solution was used to hydrodissect and hydrodelineate the nucleus. Phacoemulsification was then used to remove the lens nucleus and epinucleus. The remaining cortex was then removed using the irrigation and aspiration handpiece. Provisc was then placed into the capsular bag to distend it for lens placement. A +19.00 D [id number] intraocular lens was then injected into the capsular bag. The  remaining viscoelastic was aspirated.   Wounds were hydrated with balanced salt solution.  The anterior chamber was inflated to a physiologic pressure with balanced salt solution.  No wound leaks were noted. Moxifloxacin was injected intracamerally.  Timolol and Brimonidine drops were applied to the eye.  The patient was taken to the recovery room in stable condition without complications of anesthesia or surgery.  [employer] 11/27/2023, 9:05 AM

## 2023-11-27 NOTE — Transfer of Care (Signed)
 Immediate Anesthesia Transfer of Care Note  Patient: Tracy Durham  Procedure(s) Performed: PHACOEMULSIFICATION, CATARACT, WITH IOL INSERTION 21.0, 01:53.8 (Right)  Patient Location: PACU  Anesthesia Type: MAC  Level of Consciousness: awake, alert  and patient cooperative  Airway and Oxygen Therapy: Patient Spontanous Breathing and Patient connected to supplemental oxygen  Post-op Assessment: Post-op Vital signs reviewed, Patient's Cardiovascular Status Stable, Respiratory Function Stable, Patent Airway and No signs of Nausea or vomiting  Post-op Vital Signs: Reviewed and stable  Complications: No notable events documented.

## 2023-11-27 NOTE — H&P (Signed)
 [facility]   Primary Care Physician:  Delfina Pao, MD Ophthalmologist: Dr. Feliciano Ober  Pre-Procedure History & Physical: HPI:  Tracy Durham is a 73 y.o. female here for cataract surgery.   Past Medical History:  Diagnosis Date   Acquired hypothyroidism    Acquired hypothyroidism    Age-related osteoporosis without current pathological fracture    Arthritis    Atherosclerosis of artery of extremity with ulceration (HCC)    Complication of anesthesia    Fentanyl  bottomed out BP   Dyspnea    Dysrhythmia    Fibromyalgia    Fractured hip (HCC)    GERD (gastroesophageal reflux disease)    Hypertension    Irregular heart beat    Major depressive disorder, recurrent, moderate (HCC)    Pneumonia    PVD (peripheral vascular disease)    Severe obesity (HCC)    Stage 3a chronic kidney disease (CKD) (HCC)    Status post peripheral artery angioplasty with insertion of stent    left SFA 6 mm x 7.5 cm in   Thyroid disease    Tobacco use disorder    VIN III (vulvar intraepithelial neoplasia III)     Past Surgical History:  Procedure Laterality Date   ABDOMINAL HYSTERECTOMY  1995   [facility]   APPENDECTOMY  Jun 21, 2010   acute appendicitis and periappendicitis   CESAREAN SECTION     CHOLECYSTECTOMY     gastric stapeling  1980's   HERNIA REPAIR  06/28/13   LOWER EXTREMITY ANGIOGRAPHY Left 01/07/2022   Procedure: Lower Extremity Angiography;  Surgeon: Marea Selinda GORMAN, MD;  Location: [facility] INVASIVE CV LAB;  Service: Cardiovascular;  Laterality: Left;    Prior to Admission medications   Medication Sig Start Date End Date Taking? Authorizing Provider  albuterol (VENTOLIN HFA) 108 (90 Base) MCG/ACT inhaler Inhale 2 puffs into the lungs every 6 (six) hours as needed for wheezing or shortness of breath.   Yes [provider]  famotidine  (PEPCID ) 40 MG tablet Take 40 mg by mouth daily.   Yes [provider]  ibandronate (BONIVA) 150 MG tablet Take 150 mg  by mouth every 30 (thirty) days. Take in the morning with a full glass of water, on an empty stomach, and do not take anything else by mouth or lie down for the next 30 min.   Yes [provider]  acetaminophen  (ACETAMINOPHEN  8 HOUR) 650 MG CR tablet Take 2 tablets (1,300 mg total) by mouth every 8 (eight) hours as needed for pain. 06/19/23   Janit Kast, PA-C  amLODipine (NORVASC) 5 MG tablet Take 1 tablet by mouth daily. 04/20/13   [provider]  atorvastatin  (LIPITOR) 10 MG tablet Take 1 tablet (10 mg total) by mouth daily. 01/07/22 01/07/23  Marea Selinda GORMAN, MD  carvedilol (COREG) 6.25 MG tablet Take 6.25 mg by mouth 2 (two) times daily with a meal.    [provider]  clopidogrel  (PLAVIX ) 75 MG tablet TAKE 1 TABLET(75 MG) BY MOUTH DAILY 10/16/23   Marea Selinda GORMAN, MD  DULoxetine (CYMBALTA) 60 MG capsule Take 60 mg by mouth daily.    [provider]  levothyroxine (SYNTHROID, LEVOTHROID) 50 MCG tablet Take 1 tablet by mouth daily. 04/26/13   [provider]    Allergies as of 10/20/2023 - Review Complete 06/19/2023  Allergen Reaction Noted   Bactrim [sulfamethoxazole-trimethoprim] Hives 05/17/2013   Biaxin [clarithromycin] Hives 05/17/2013   Ciprofloxacin Hives 05/17/2013   Fentanyl  Hives and Other (  See Comments) 06/06/2022   Keflex [cephalexin] Hives 05/17/2013   Morphine  and codeine Hives 05/17/2013   Sulfa antibiotics Hives 05/17/2013   Tramadol Hives 05/17/2013    Family History  Problem Relation Age of Onset   Alcohol abuse Mother    Dementia Mother    Cancer Mother        ovarian   Diabetes Father    Hypertension Father    Stroke Father    Alcohol abuse Brother    Alcohol abuse Sister    Cancer Sister        cervical    Social History   Socioeconomic History   Marital status: Married    Spouse name: Not on file   Number of children: Not on file   Years of education: Not on file   Highest education level: Not on file   Occupational History   Not on file  Tobacco Use   Smoking status: Every Day    Current packs/day: 1.00    Average packs/day: 0.6 packs/day for 40.4 years (25.4 ttl pk-yrs)    Types: Cigarettes    Start date: 05/26/1983    Last attempt to quit: 05/25/2013    Passive exposure: Never   Smokeless tobacco: Never  Vaping Use   Vaping status: Never Used  Substance and Sexual Activity   Alcohol use: No   Drug use: Yes    Types: Marijuana    Comment: in the last month   Sexual activity: Not on file  Other Topics Concern   Not on file  Social History Narrative   Not on file   Social Drivers of Health   Financial Resource Strain: Medium Risk (01/06/2023)   Received from [facility]   Overall Financial Resource Strain (CARDIA)    Difficulty of Paying Living Expenses: Somewhat hard  Food Insecurity: Food Insecurity Present (01/06/2023)   Received from [facility]   Hunger Vital Sign    Within the past 12 months, you worried that your food would run out before you got the money to buy more.: Sometimes true    Within the past 12 months, the food you bought just didn't last and you didn't have money to get more.: Sometimes true  Transportation Needs: No Transportation Needs (01/06/2023)   Received from [facility]    In the past 12 months, has lack of transportation kept you from medical appointments or from getting medications?: No    Lack of Transportation (Non-Medical): No  Physical Activity: Not on file  Stress: Not on file  Social Connections: Not on file  Intimate Partner Violence: Not on file    Review of Systems: See HPI, otherwise negative ROS  Physical Exam: Ht 4' 9 (1.448 m)   Wt 92.5 kg   BMI 44.15 kg/m  General:   Alert, cooperative in NAD Head:  Normocephalic and atraumatic. Respiratory:  Normal work of breathing. Cardiovascular:  RRR  Impression/Plan: Tracy Durham is here for  cataract surgery.  Risks, benefits, limitations, and alternatives regarding cataract surgery have been reviewed with the patient.  Questions have been answered.  All parties agreeable.   Feliciano Bryan Ober, MD  11/27/2023, 7:11 AM

## 2023-11-28 ENCOUNTER — Encounter: Payer: Self-pay | Admitting: Ophthalmology

## 2023-12-01 ENCOUNTER — Other Ambulatory Visit: Payer: Self-pay

## 2023-12-01 ENCOUNTER — Encounter: Payer: Self-pay | Admitting: Ophthalmology

## 2023-12-10 NOTE — Anesthesia Preprocedure Evaluation (Addendum)
 Anesthesia Evaluation  Patient identified by MRN, date of birth, ID band Patient awake    Reviewed: Allergy & Precautions, H&P , NPO status , Patient's Chart, lab work & pertinent test results  History of Anesthesia Complications (+) history of anesthetic complications  Airway Mallampati: III  TM Distance: >3 FB Neck ROM: Full    Dental no notable dental hx.  Plans to soon have all upper teeth pulled and have dentures upper: :   Pulmonary shortness of breath, pneumonia, Current Smoker   Pulmonary exam normal breath sounds clear to auscultation       Cardiovascular hypertension, + Peripheral Vascular Disease  Normal cardiovascular exam+ dysrhythmias  Rhythm:Regular Rate:Normal     Neuro/Psych  PSYCHIATRIC DISORDERS  Depression     Neuromuscular disease negative neurological ROS  negative psych ROS   GI/Hepatic negative GI ROS, Neg liver ROS,GERD  ,,  Endo/Other  negative endocrine ROSHypothyroidism    Renal/GU Renal diseasenegative Renal ROS  negative genitourinary   Musculoskeletal negative musculoskeletal ROS (+) Arthritis ,  Fibromyalgia -  Abdominal   Peds negative pediatric ROS (+)  Hematology negative hematology ROS (+)   Anesthesia Other Findings Previous cataract surgery 11-27-23 Dr. Ola  Patient denies hives from fentanyl . She said, It made my blood pressure bottom out when I was having a stent. Morphine  makes me sick.  Discussed using low dose fentanyl , to help with the discomfort of pressure and cutting that she felt during the last procedure, and she jumped due to pain. Previous cataract surgery she had versed  2 mg IV, and this time, would like not to feel the pain and pressure. So we agreed will use fentanyl .  Arthritis             Fibromyalgia Thyroid disease            Hypertension GERD (gastroesophageal reflux disease)Irregular heart beat Fractured hip       VIN III (vulvar  intraepithelial neoplasia III) PVD (peripheral vascular disease)Acquired hypothyroidism Status post peripheral artery angioplasty with insertion of stent       Major depressive disorder, recurrent, moderate Tobacco use disorder    Stage 3a chronic kidney disease Atherosclerosis of artery of extremity with ulceration   Acquired hypothyroidism Age-related osteoporosis without current pathological fracture     Severe obesity    Reproductive/Obstetrics negative OB ROS                              Anesthesia Physical Anesthesia Plan  ASA: 3  Anesthesia Plan: MAC   Post-op Pain Management:    Induction: Intravenous  PONV Risk Score and Plan:   Airway Management Planned: Natural Airway and Nasal Cannula  Additional Equipment:   Intra-op Plan:   Post-operative Plan:   Informed Consent: I have reviewed the patients History and Physical, chart, labs and discussed the procedure including the risks, benefits and alternatives for the proposed anesthesia with the patient or authorized representative who has indicated his/her understanding and acceptance.     Dental Advisory Given  Plan Discussed with: Anesthesiologist, CRNA and Surgeon  Anesthesia Plan Comments: (Patient consented for risks of anesthesia including but not limited to:  - adverse reactions to medications - damage to eyes, teeth, lips or other oral mucosa - nerve damage due to positioning  - sore throat or hoarseness - Damage to heart, brain, nerves, lungs, other parts of body or loss of life  Patient voiced understanding and assent.)  Anesthesia Quick Evaluation

## 2023-12-10 NOTE — Discharge Instructions (Signed)

## 2023-12-11 ENCOUNTER — Encounter: Payer: Self-pay | Admitting: Ophthalmology

## 2023-12-11 ENCOUNTER — Ambulatory Visit: Payer: Self-pay | Admitting: General Practice

## 2023-12-11 ENCOUNTER — Encounter
Admission: RE | Disposition: A | Discharge status: Home / Self Care | Payer: Self-pay | Source: Home / Self Care | Attending: Ophthalmology

## 2023-12-11 ENCOUNTER — Other Ambulatory Visit: Payer: Self-pay

## 2023-12-11 ENCOUNTER — Ambulatory Visit
Admission: RE | Admit: 2023-12-11 | Discharge: 2023-12-11 | Disposition: A | Discharge status: Home / Self Care | Attending: Ophthalmology | Admitting: Ophthalmology

## 2023-12-11 DIAGNOSIS — M1990 Unspecified osteoarthritis, unspecified site: Secondary | ICD-10-CM | POA: Insufficient documentation

## 2023-12-11 DIAGNOSIS — E039 Hypothyroidism, unspecified: Secondary | ICD-10-CM | POA: Diagnosis not present

## 2023-12-11 DIAGNOSIS — F17210 Nicotine dependence, cigarettes, uncomplicated: Secondary | ICD-10-CM | POA: Diagnosis not present

## 2023-12-11 DIAGNOSIS — H2512 Age-related nuclear cataract, left eye: Secondary | ICD-10-CM | POA: Diagnosis present

## 2023-12-11 DIAGNOSIS — K219 Gastro-esophageal reflux disease without esophagitis: Secondary | ICD-10-CM | POA: Diagnosis not present

## 2023-12-11 DIAGNOSIS — I129 Hypertensive chronic kidney disease with stage 1 through stage 4 chronic kidney disease, or unspecified chronic kidney disease: Secondary | ICD-10-CM | POA: Diagnosis not present

## 2023-12-11 DIAGNOSIS — I739 Peripheral vascular disease, unspecified: Secondary | ICD-10-CM | POA: Insufficient documentation

## 2023-12-11 DIAGNOSIS — N1831 Chronic kidney disease, stage 3a: Secondary | ICD-10-CM | POA: Diagnosis not present

## 2023-12-11 DIAGNOSIS — F32A Depression, unspecified: Secondary | ICD-10-CM | POA: Diagnosis not present

## 2023-12-11 DIAGNOSIS — M797 Fibromyalgia: Secondary | ICD-10-CM | POA: Diagnosis not present

## 2023-12-11 HISTORY — PX: CATARACT EXTRACTION W/PHACO: SHX586

## 2023-12-11 SURGERY — PHACOEMULSIFICATION, CATARACT, WITH IOL INSERTION
Anesthesia: Monitor Anesthesia Care | Laterality: Left

## 2023-12-11 MED ORDER — TETRACAINE HCL 0.5 % OP SOLN
1.0000 [drp] | OPHTHALMIC | Status: AC
  Administered 2023-12-11 (×3): 1 [drp] via OPHTHALMIC

## 2023-12-11 MED ORDER — LIDOCAINE HCL (PF) 2 % IJ SOLN
INTRAOCULAR | Status: DC | PRN
  Administered 2023-12-11: 1 mL via INTRAOCULAR

## 2023-12-11 MED ORDER — SIGHTPATH DOSE#1 NA HYALUR & NA CHOND-NA HYALUR IO KIT
PACK | INTRAOCULAR | Status: DC | PRN
  Administered 2023-12-11: 1 via OPHTHALMIC

## 2023-12-11 MED ORDER — MIDAZOLAM HCL 5 MG/5ML IJ SOLN
INTRAMUSCULAR | Status: DC | PRN
  Administered 2023-12-11: 2 mg via INTRAVENOUS

## 2023-12-11 MED ORDER — MIDAZOLAM HCL 2 MG/2ML IJ SOLN
INTRAMUSCULAR | Status: AC
  Filled 2023-12-11: qty 2

## 2023-12-11 MED ORDER — SIGHTPATH DOSE#1 BSS IO SOLN
INTRAOCULAR | Status: DC | PRN
  Administered 2023-12-11: 15 mL via INTRAOCULAR

## 2023-12-11 MED ORDER — CYCLOPENTOLATE HCL 2 % OP SOLN
1.0000 [drp] | OPHTHALMIC | Status: AC
  Administered 2023-12-11 (×2): 1 [drp] via OPHTHALMIC

## 2023-12-11 MED ORDER — TETRACAINE HCL 0.5 % OP SOLN
OPHTHALMIC | Status: AC
  Filled 2023-12-11: qty 4

## 2023-12-11 MED ORDER — CYCLOPENTOLATE HCL 2 % OP SOLN
OPHTHALMIC | Status: AC
  Filled 2023-12-11: qty 2

## 2023-12-11 MED ORDER — MOXIFLOXACIN HCL 0.5 % OP SOLN
OPHTHALMIC | Status: DC | PRN
  Administered 2023-12-11: .2 mL via OPHTHALMIC

## 2023-12-11 MED ORDER — PHENYLEPHRINE HCL 10 % OP SOLN
OPHTHALMIC | Status: AC
  Filled 2023-12-11: qty 5

## 2023-12-11 MED ORDER — SIGHTPATH DOSE#1 BSS IO SOLN
INTRAOCULAR | Status: DC | PRN
  Administered 2023-12-11: 102 mL via OPHTHALMIC

## 2023-12-11 MED ORDER — BRIMONIDINE TARTRATE-TIMOLOL 0.2-0.5 % OP SOLN
OPHTHALMIC | Status: DC | PRN
  Administered 2023-12-11: 1 [drp] via OPHTHALMIC

## 2023-12-11 MED ORDER — FENTANYL CITRATE (PF) 100 MCG/2ML IJ SOLN
INTRAMUSCULAR | Status: AC
  Filled 2023-12-11: qty 2

## 2023-12-11 MED ORDER — FENTANYL CITRATE (PF) 100 MCG/2ML IJ SOLN
INTRAMUSCULAR | Status: DC | PRN
  Administered 2023-12-11: 50 ug via INTRAVENOUS

## 2023-12-11 MED ORDER — PHENYLEPHRINE HCL 10 % OP SOLN
1.0000 [drp] | OPHTHALMIC | Status: AC
  Administered 2023-12-11 (×2): 1 [drp] via OPHTHALMIC

## 2023-12-11 MED ORDER — LACTATED RINGERS IV SOLN: INTRAVENOUS | Status: DC

## 2023-12-11 SURGICAL SUPPLY — 12 items
DISSECTOR HYDRO NUCLEUS 50X22 (MISCELLANEOUS) ×1 IMPLANT
DRSG TEGADERM 2-3/8X2-3/4 SM (GAUZE/BANDAGES/DRESSINGS) ×1 IMPLANT
FEE CATARACT SUITE SIGHTPATH (MISCELLANEOUS) ×1 IMPLANT
GLOVE BIOGEL PI IND STRL 8 (GLOVE) ×1 IMPLANT
GLOVE SURG LX STRL 7.5 STRW (GLOVE) ×1 IMPLANT
GLOVE SURG SYN 6.5 PF PI BL (GLOVE) ×1 IMPLANT
LENS IOL TECNIS MONO 20.0 (Intraocular Lens) IMPLANT
NDL FILTER BLUNT 18X1 1/2 (NEEDLE) ×1 IMPLANT
NEEDLE FILTER BLUNT 18X1 1/2 (NEEDLE) ×1 IMPLANT
SPONGE SURG I SPEAR (MISCELLANEOUS) IMPLANT
SUT NYLON 10-0 (SUTURE) IMPLANT
SYR 3ML LL SCALE MARK (SYRINGE) ×1 IMPLANT

## 2023-12-11 NOTE — H&P (Signed)
 [facility]   Primary Care Physician:  Delfina Pao, MD Ophthalmologist: Dr. Feliciano Ober  Pre-Procedure History & Physical: HPI:  Tracy Durham is a 73 y.o. female here for cataract surgery.   Past Medical History:  Diagnosis Date   Acquired hypothyroidism    Acquired hypothyroidism    Age-related osteoporosis without current pathological fracture    Arthritis    Atherosclerosis of artery of extremity with ulceration (HCC)    Complication of anesthesia    Fentanyl  bottomed out BP   Dyspnea    Dysrhythmia    Fibromyalgia    Fractured hip (HCC)    GERD (gastroesophageal reflux disease)    Hypertension    Irregular heart beat    Major depressive disorder, recurrent, moderate (HCC)    Pneumonia    PVD (peripheral vascular disease)    Severe obesity (HCC)    Stage 3a chronic kidney disease (CKD) (HCC)    Status post peripheral artery angioplasty with insertion of stent    left SFA 6 mm x 7.5 cm in   Thyroid disease    Tobacco use disorder    VIN III (vulvar intraepithelial neoplasia III)     Past Surgical History:  Procedure Laterality Date   ABDOMINAL HYSTERECTOMY  1995   [facility]   APPENDECTOMY  Jun 21, 2010   acute appendicitis and periappendicitis   CATARACT EXTRACTION W/PHACO Right 11/27/2023   Procedure: PHACOEMULSIFICATION, CATARACT, WITH IOL INSERTION 21.0, 01:53.8;  Surgeon: Ober Feliciano Hugger, MD;  Location: [facility];  Service: Ophthalmology;  Laterality: Right;   CESAREAN SECTION     CHOLECYSTECTOMY     gastric stapeling  1980's   HERNIA REPAIR  06/28/13   LOWER EXTREMITY ANGIOGRAPHY Left 01/07/2022   Procedure: Lower Extremity Angiography;  Surgeon: Marea Selinda GORMAN, MD;  Location: [facility] INVASIVE CV LAB;  Service: Cardiovascular;  Laterality: Left;    Prior to Admission medications   Medication Sig Start Date End Date Taking? Authorizing Provider  acetaminophen  (ACETAMINOPHEN  8 HOUR) 650 MG CR tablet Take 2 tablets (1,300 mg  total) by mouth every 8 (eight) hours as needed for pain. 06/19/23   Evans, Alexandra, PA-C  albuterol (VENTOLIN HFA) 108 (90 Base) MCG/ACT inhaler Inhale 2 puffs into the lungs every 6 (six) hours as needed for wheezing or shortness of breath.    [provider]  amLODipine (NORVASC) 5 MG tablet Take 1 tablet by mouth daily. 04/20/13   [provider]  atorvastatin  (LIPITOR) 10 MG tablet Take 1 tablet (10 mg total) by mouth daily. 01/07/22 01/07/23  Marea Selinda GORMAN, MD  carvedilol (COREG) 6.25 MG tablet Take 6.25 mg by mouth 2 (two) times daily with a meal.    [provider]  clopidogrel  (PLAVIX ) 75 MG tablet TAKE 1 TABLET(75 MG) BY MOUTH DAILY 10/16/23   Marea Selinda GORMAN, MD  DULoxetine (CYMBALTA) 60 MG capsule Take 60 mg by mouth daily.    [provider]  famotidine  (PEPCID ) 40 MG tablet Take 40 mg by mouth daily.    [provider]  ibandronate (BONIVA) 150 MG tablet Take 150 mg by mouth every 30 (thirty) days. Take in the morning with a full glass of water, on an empty stomach, and do not take anything else by mouth or lie down for the next 30 min.    [provider]  levothyroxine (SYNTHROID, LEVOTHROID) 50 MCG tablet Take 1 tablet by mouth daily. 04/26/13   [provider]    Allergies  as of 10/20/2023 - Review Complete 06/19/2023  Allergen Reaction Noted   Bactrim [sulfamethoxazole-trimethoprim] Hives 05/17/2013   Biaxin [clarithromycin] Hives 05/17/2013   Ciprofloxacin Hives 05/17/2013   Fentanyl  Hives and Other (See Comments) 06/06/2022   Keflex [cephalexin] Hives 05/17/2013   Morphine  and codeine Hives 05/17/2013   Sulfa antibiotics Hives 05/17/2013   Tramadol Hives 05/17/2013    Family History  Problem Relation Age of Onset   Alcohol abuse Mother    Dementia Mother    Cancer Mother        ovarian   Diabetes Father    Hypertension Father    Stroke Father    Alcohol abuse Brother    Alcohol abuse Sister    Cancer  Sister        cervical    Social History   Socioeconomic History   Marital status: Married    Spouse name: Not on file   Number of children: Not on file   Years of education: Not on file   Highest education level: Not on file  Occupational History   Not on file  Tobacco Use   Smoking status: Every Day    Current packs/day: 1.00    Average packs/day: 0.6 packs/day for 40.5 years (25.5 ttl pk-yrs)    Types: Cigarettes    Start date: 05/26/1983    Last attempt to quit: 05/25/2013    Passive exposure: Never   Smokeless tobacco: Never  Vaping Use   Vaping status: Never Used  Substance and Sexual Activity   Alcohol use: No   Drug use: Yes    Types: Marijuana    Comment: in the last month   Sexual activity: Not on file  Other Topics Concern   Not on file  Social History Narrative   Not on file   Social Drivers of Health   Financial Resource Strain: Medium Risk (01/06/2023)   Received from [facility]   Overall Financial Resource Strain (CARDIA)    Difficulty of Paying Living Expenses: Somewhat hard  Food Insecurity: Food Insecurity Present (01/06/2023)   Received from [facility]   Hunger Vital Sign    Within the past 12 months, you worried that your food would run out before you got the money to buy more.: Sometimes true    Within the past 12 months, the food you bought just didn't last and you didn't have money to get more.: Sometimes true  Transportation Needs: No Transportation Needs (01/06/2023)   Received from [facility] Transportation    In the past 12 months, has lack of transportation kept you from medical appointments or from getting medications?: No    Lack of Transportation (Non-Medical): No  Physical Activity: Not on file  Stress: Not on file  Social Connections: Not on file  Intimate Partner Violence: Not on file    Review of Systems: See HPI, otherwise negative ROS  Physical Exam: There  were no vitals taken for this visit. General:   Alert, cooperative in NAD Head:  Normocephalic and atraumatic. Respiratory:  Normal work of breathing. Cardiovascular:  RRR  Impression/Plan: Tracy Durham is here for cataract surgery.  Risks, benefits, limitations, and alternatives regarding cataract surgery have been reviewed with the patient.  Questions have been answered.  All parties agreeable.   Feliciano Bryan Ober, MD  12/11/2023, 7:09 AM

## 2023-12-11 NOTE — Anesthesia Postprocedure Evaluation (Signed)
 Anesthesia Post Note  Patient: Tracy Durham  Procedure(s) Performed: PHACOEMULSIFICATION, CATARACT, WITH IOL INSERTION 24.39, 02:00.5 (Left)  Patient location during evaluation: PACU Anesthesia Type: MAC Level of consciousness: awake and alert Pain management: pain level controlled Vital Signs Assessment: post-procedure vital signs reviewed and stable Respiratory status: spontaneous breathing, nonlabored ventilation, respiratory function stable and patient connected to nasal cannula oxygen Cardiovascular status: stable and blood pressure returned to baseline Postop Assessment: no apparent nausea or vomiting Anesthetic complications: no   No notable events documented.   Last Vitals:  Vitals:   12/11/23 0951 12/11/23 0955  BP: (!) 146/73 (!) 159/77  Pulse: (!) 55 (!) 56  Resp: 15 18  Temp: (!) 36.1 C (!) 36.1 C  SpO2: 100% 97%    Last Pain:  Vitals:   12/11/23 0955  TempSrc:   PainSc: 0-No pain                 Viva Gallaher C Jarrick Fjeld

## 2023-12-11 NOTE — Op Note (Signed)
 OPERATIVE NOTE  Tracy Durham [id number] 12/11/2023   PREOPERATIVE DIAGNOSIS: Nuclear sclerotic cataract left eye. H25.12   POSTOPERATIVE DIAGNOSIS: Nuclear sclerotic cataract left eye. H25.12   PROCEDURE:  Phacoemulsification with posterior chamber intraocular lens placement of the left eye  Ultrasound time: Procedure(s): PHACOEMULSIFICATION, CATARACT, WITH IOL INSERTION 24.39, 02:00.5 (Left)  LENS:   Implant Name Type Inv. Item Serial No. Manufacturer Lot No. LRB No. Used Action  LENS IOL TECNIS MONO 20.0 - [id number] Intraocular Lens LENS IOL TECNIS MONO 20.0 [id number] [employer]  Left 1 Implanted      SURGEON:  Feliciano HERO. [place], MD   ANESTHESIA:  Topical with tetracaine drops, augmented with 1% preservative-free intracameral lidocaine .   COMPLICATIONS:  None.   DESCRIPTION OF PROCEDURE:  The patient was identified in the holding room and transported to the operating room and placed in the supine position under the operating microscope.  The left eye was identified as the operative eye, which was prepped and draped in the usual sterile ophthalmic fashion.   A 1 millimeter clear-corneal paracentesis was made inferotemporally. Preservative-free 1% lidocaine  mixed with 1:1,000 bisulfite-free aqueous solution of epinephrine was injected into the anterior chamber. The anterior chamber was then filled with Viscoat viscoelastic. A 2.4 millimeter keratome was used to make a clear-corneal incision superotemporally. A curvilinear capsulorrhexis was made with a cystotome and capsulorrhexis forceps. Balanced salt solution was used to hydrodissect and hydrodelineate the nucleus. Phacoemulsification was then used to remove the lens nucleus and epinucleus. The remaining cortex was then removed using the irrigation and aspiration handpiece. Provisc was then placed into the capsular bag to distend it for lens placement. A +20.00 D DCB00 intraocular lens was then injected into the capsular bag. The  remaining viscoelastic was aspirated.   Wounds were hydrated with balanced salt solution.  The anterior chamber was inflated to a physiologic pressure with balanced salt solution.  The main incision was still leaking despite hydration, so a 10-0 Nylon suture was passed through the main incision. The suture was tied in a 3-1-1 fashion and the knot was buried. The incision was confirmed to longer be leaking. Moxifloxacin was injected intracamerally.  Timolol and Brimonidine drops were applied to the eye.  The patient was taken to the recovery room in stable condition without complications of anesthesia or surgery.  [name] 12/11/2023, 9:49 AM

## 2023-12-11 NOTE — Transfer of Care (Signed)
 Immediate Anesthesia Transfer of Care Note  Patient: Tracy Durham  Procedure(s) Performed: PHACOEMULSIFICATION, CATARACT, WITH IOL INSERTION 24.39, 02:00.5 (Left)  Patient Location: PACU  Anesthesia Type: MAC  Level of Consciousness: awake, alert  and patient cooperative  Airway and Oxygen Therapy: Patient Spontanous Breathing and Patient connected to supplemental oxygen  Post-op Assessment: Post-op Vital signs reviewed, Patient's Cardiovascular Status Stable, Respiratory Function Stable, Patent Airway and No signs of Nausea or vomiting  Post-op Vital Signs: Reviewed and stable  Complications: No notable events documented.

## 2023-12-24 ENCOUNTER — Other Ambulatory Visit (INDEPENDENT_AMBULATORY_CARE_PROVIDER_SITE_OTHER): Payer: Self-pay | Admitting: Nurse Practitioner

## 2023-12-24 DIAGNOSIS — I739 Peripheral vascular disease, unspecified: Secondary | ICD-10-CM

## 2023-12-30 ENCOUNTER — Encounter (INDEPENDENT_AMBULATORY_CARE_PROVIDER_SITE_OTHER): Payer: Self-pay

## 2023-12-30 ENCOUNTER — Encounter (INDEPENDENT_AMBULATORY_CARE_PROVIDER_SITE_OTHER)

## 2023-12-30 ENCOUNTER — Ambulatory Visit (INDEPENDENT_AMBULATORY_CARE_PROVIDER_SITE_OTHER): Admitting: Vascular Surgery

## 2024-01-03 ENCOUNTER — Other Ambulatory Visit (INDEPENDENT_AMBULATORY_CARE_PROVIDER_SITE_OTHER): Payer: Self-pay | Admitting: Vascular Surgery

## 2024-01-14 ENCOUNTER — Other Ambulatory Visit: Payer: Self-pay | Admitting: Pediatrics

## 2024-01-14 DIAGNOSIS — Z1231 Encounter for screening mammogram for malignant neoplasm of breast: Secondary | ICD-10-CM

## 2024-01-18 ENCOUNTER — Other Ambulatory Visit (INDEPENDENT_AMBULATORY_CARE_PROVIDER_SITE_OTHER): Payer: Self-pay | Admitting: Vascular Surgery
